# Patient Record
Sex: Male | Born: 1955
Health system: Southern US, Community
[De-identification: ages and names within clinical notes are randomized; demographics above are authoritative.]

---

## 2016-10-08 DIAGNOSIS — H409 Unspecified glaucoma: Secondary | ICD-10-CM | POA: Diagnosis not present

## 2017-06-11 DIAGNOSIS — Z0000 Encounter for general adult medical examination without abnormal findings: Secondary | ICD-10-CM | POA: Diagnosis not present

## 2017-06-11 DIAGNOSIS — Z125 Encounter for screening for malignant neoplasm of prostate: Secondary | ICD-10-CM | POA: Diagnosis not present

## 2017-06-11 DIAGNOSIS — Z1211 Encounter for screening for malignant neoplasm of colon: Secondary | ICD-10-CM | POA: Diagnosis not present

## 2017-06-11 DIAGNOSIS — Z13220 Encounter for screening for lipoid disorders: Secondary | ICD-10-CM | POA: Diagnosis not present

## 2017-06-11 DIAGNOSIS — Z23 Encounter for immunization: Secondary | ICD-10-CM | POA: Diagnosis not present

## 2017-06-11 DIAGNOSIS — H409 Unspecified glaucoma: Secondary | ICD-10-CM | POA: Diagnosis not present

## 2017-06-11 DIAGNOSIS — Z6830 Body mass index (BMI) 30.0-30.9, adult: Secondary | ICD-10-CM | POA: Diagnosis not present

## 2017-09-23 DIAGNOSIS — E782 Mixed hyperlipidemia: Secondary | ICD-10-CM | POA: Diagnosis not present

## 2018-03-14 DIAGNOSIS — H401131 Primary open-angle glaucoma, bilateral, mild stage: Secondary | ICD-10-CM | POA: Diagnosis not present

## 2018-06-16 DIAGNOSIS — E782 Mixed hyperlipidemia: Secondary | ICD-10-CM | POA: Diagnosis not present

## 2018-06-16 DIAGNOSIS — Z1211 Encounter for screening for malignant neoplasm of colon: Secondary | ICD-10-CM | POA: Diagnosis not present

## 2018-06-16 DIAGNOSIS — R351 Nocturia: Secondary | ICD-10-CM | POA: Diagnosis not present

## 2018-06-16 DIAGNOSIS — Z5181 Encounter for therapeutic drug level monitoring: Secondary | ICD-10-CM | POA: Diagnosis not present

## 2018-06-16 DIAGNOSIS — Z23 Encounter for immunization: Secondary | ICD-10-CM | POA: Diagnosis not present

## 2018-06-16 DIAGNOSIS — Z0000 Encounter for general adult medical examination without abnormal findings: Secondary | ICD-10-CM | POA: Diagnosis not present

## 2018-06-16 DIAGNOSIS — Z125 Encounter for screening for malignant neoplasm of prostate: Secondary | ICD-10-CM | POA: Diagnosis not present

## 2018-06-16 DIAGNOSIS — R739 Hyperglycemia, unspecified: Secondary | ICD-10-CM | POA: Diagnosis not present

## 2018-09-17 DIAGNOSIS — H401131 Primary open-angle glaucoma, bilateral, mild stage: Secondary | ICD-10-CM | POA: Diagnosis not present

## 2018-09-19 DIAGNOSIS — Z125 Encounter for screening for malignant neoplasm of prostate: Secondary | ICD-10-CM | POA: Diagnosis not present

## 2018-09-19 DIAGNOSIS — E782 Mixed hyperlipidemia: Secondary | ICD-10-CM | POA: Diagnosis not present

## 2018-10-29 DIAGNOSIS — H401131 Primary open-angle glaucoma, bilateral, mild stage: Secondary | ICD-10-CM | POA: Diagnosis not present

## 2019-10-02 ENCOUNTER — Ambulatory Visit
Admission: RE | Admit: 2019-10-02 | Discharge: 2019-10-02 | Disposition: A | Discharge status: Home / Self Care | Payer: BC Managed Care – PPO | Source: Ambulatory Visit | Attending: Family Medicine | Admitting: Family Medicine

## 2019-10-02 ENCOUNTER — Other Ambulatory Visit: Payer: Self-pay | Admitting: Family Medicine

## 2019-10-02 DIAGNOSIS — M160 Bilateral primary osteoarthritis of hip: Secondary | ICD-10-CM | POA: Diagnosis not present

## 2019-10-02 DIAGNOSIS — R52 Pain, unspecified: Secondary | ICD-10-CM

## 2019-10-02 DIAGNOSIS — M25552 Pain in left hip: Secondary | ICD-10-CM | POA: Diagnosis not present

## 2019-10-02 DIAGNOSIS — M25551 Pain in right hip: Secondary | ICD-10-CM | POA: Diagnosis not present

## 2019-11-10 DIAGNOSIS — M25552 Pain in left hip: Secondary | ICD-10-CM | POA: Diagnosis not present

## 2019-11-27 DIAGNOSIS — R739 Hyperglycemia, unspecified: Secondary | ICD-10-CM | POA: Diagnosis not present

## 2019-11-27 DIAGNOSIS — Z125 Encounter for screening for malignant neoplasm of prostate: Secondary | ICD-10-CM | POA: Diagnosis not present

## 2019-11-27 DIAGNOSIS — Z5181 Encounter for therapeutic drug level monitoring: Secondary | ICD-10-CM | POA: Diagnosis not present

## 2019-11-27 DIAGNOSIS — E782 Mixed hyperlipidemia: Secondary | ICD-10-CM | POA: Diagnosis not present

## 2019-11-27 DIAGNOSIS — Z0000 Encounter for general adult medical examination without abnormal findings: Secondary | ICD-10-CM | POA: Diagnosis not present

## 2019-11-30 DIAGNOSIS — Z1211 Encounter for screening for malignant neoplasm of colon: Secondary | ICD-10-CM | POA: Diagnosis not present

## 2019-12-16 DIAGNOSIS — L820 Inflamed seborrheic keratosis: Secondary | ICD-10-CM | POA: Diagnosis not present

## 2019-12-16 DIAGNOSIS — L814 Other melanin hyperpigmentation: Secondary | ICD-10-CM | POA: Diagnosis not present

## 2019-12-16 DIAGNOSIS — D225 Melanocytic nevi of trunk: Secondary | ICD-10-CM | POA: Diagnosis not present

## 2019-12-16 DIAGNOSIS — D1801 Hemangioma of skin and subcutaneous tissue: Secondary | ICD-10-CM | POA: Diagnosis not present

## 2019-12-16 DIAGNOSIS — L821 Other seborrheic keratosis: Secondary | ICD-10-CM | POA: Diagnosis not present

## 2020-01-11 DIAGNOSIS — H401131 Primary open-angle glaucoma, bilateral, mild stage: Secondary | ICD-10-CM | POA: Diagnosis not present

## 2020-02-01 ENCOUNTER — Other Ambulatory Visit: Payer: Self-pay

## 2020-02-01 ENCOUNTER — Emergency Department (HOSPITAL_COMMUNITY)
Admission: EM | Admit: 2020-02-01 | Discharge: 2020-02-02 | Disposition: A | Discharge status: Home / Self Care | Payer: BC Managed Care – PPO | Attending: Emergency Medicine | Admitting: Emergency Medicine

## 2020-02-01 DIAGNOSIS — N200 Calculus of kidney: Secondary | ICD-10-CM | POA: Diagnosis not present

## 2020-02-01 DIAGNOSIS — R1032 Left lower quadrant pain: Secondary | ICD-10-CM | POA: Insufficient documentation

## 2020-02-01 DIAGNOSIS — R112 Nausea with vomiting, unspecified: Secondary | ICD-10-CM | POA: Insufficient documentation

## 2020-02-01 DIAGNOSIS — R9431 Abnormal electrocardiogram [ECG] [EKG]: Secondary | ICD-10-CM | POA: Diagnosis not present

## 2020-02-01 DIAGNOSIS — R197 Diarrhea, unspecified: Secondary | ICD-10-CM | POA: Insufficient documentation

## 2020-02-01 DIAGNOSIS — R110 Nausea: Secondary | ICD-10-CM | POA: Diagnosis not present

## 2020-02-01 DIAGNOSIS — I10 Essential (primary) hypertension: Secondary | ICD-10-CM | POA: Diagnosis not present

## 2020-02-01 LAB — URINALYSIS, ROUTINE W REFLEX MICROSCOPIC
Bacteria, UA: NONE SEEN
Bilirubin Urine: NEGATIVE
Glucose, UA: NEGATIVE mg/dL
Ketones, ur: 20 mg/dL — AB
Leukocytes,Ua: NEGATIVE
Nitrite: NEGATIVE
Protein, ur: NEGATIVE mg/dL
Specific Gravity, Urine: 1.016 (ref 1.005–1.030)
pH: 6 (ref 5.0–8.0)

## 2020-02-01 LAB — COMPREHENSIVE METABOLIC PANEL
ALT: 23 U/L (ref 0–44)
AST: 18 U/L (ref 15–41)
Albumin: 4.6 g/dL (ref 3.5–5.0)
Alkaline Phosphatase: 68 U/L (ref 38–126)
Anion gap: 13 (ref 5–15)
BUN: 24 mg/dL — ABNORMAL HIGH (ref 8–23)
CO2: 23 mmol/L (ref 22–32)
Calcium: 9.2 mg/dL (ref 8.9–10.3)
Chloride: 105 mmol/L (ref 98–111)
Creatinine, Ser: 1.74 mg/dL — ABNORMAL HIGH (ref 0.61–1.24)
GFR calc Af Amer: 47 mL/min — ABNORMAL LOW (ref >60)
GFR calc non Af Amer: 41 mL/min — ABNORMAL LOW (ref >60)
Glucose, Bld: 139 mg/dL — ABNORMAL HIGH (ref 70–99)
Potassium: 4.3 mmol/L (ref 3.5–5.1)
Sodium: 141 mmol/L (ref 135–145)
Total Bilirubin: 0.8 mg/dL (ref 0.3–1.2)
Total Protein: 7.1 g/dL (ref 6.5–8.1)

## 2020-02-01 LAB — CBC
HCT: 46.5 % (ref 39.0–52.0)
Hemoglobin: 16 g/dL (ref 13.0–17.0)
MCH: 32 pg (ref 26.0–34.0)
MCHC: 34.4 g/dL (ref 30.0–36.0)
MCV: 93 fL (ref 80.0–100.0)
Platelets: 192 10*3/uL (ref 150–400)
RBC: 5 MIL/uL (ref 4.22–5.81)
RDW: 13.1 % (ref 11.5–15.5)
WBC: 12.3 10*3/uL — ABNORMAL HIGH (ref 4.0–10.5)
nRBC: 0 % (ref 0.0–0.2)

## 2020-02-01 LAB — LIPASE, BLOOD: Lipase: 25 U/L (ref 11–51)

## 2020-02-01 MED ORDER — SODIUM CHLORIDE 0.9 % IV SOLN
1000.0000 mL | INTRAVENOUS | Status: DC
  Administered 2020-02-01: 1000 mL via INTRAVENOUS

## 2020-02-01 MED ORDER — HYDROMORPHONE HCL 1 MG/ML IJ SOLN
1.0000 mg | Freq: Once | INTRAMUSCULAR | Status: AC
  Administered 2020-02-01: 1 mg via INTRAVENOUS
  Filled 2020-02-01: qty 1

## 2020-02-01 MED ORDER — SODIUM CHLORIDE 0.9 % IV BOLUS (SEPSIS)
1000.0000 mL | Freq: Once | INTRAVENOUS | Status: AC
  Administered 2020-02-01: 1000 mL via INTRAVENOUS

## 2020-02-01 MED ORDER — SODIUM CHLORIDE 0.9% FLUSH
3.0000 mL | Freq: Once | INTRAVENOUS | Status: AC
  Administered 2020-02-01: 3 mL via INTRAVENOUS

## 2020-02-01 MED ORDER — ONDANSETRON HCL 4 MG/2ML IJ SOLN
4.0000 mg | Freq: Once | INTRAMUSCULAR | Status: AC | PRN
  Administered 2020-02-01: 4 mg via INTRAVENOUS
  Filled 2020-02-01: qty 2

## 2020-02-01 NOTE — ED Notes (Addendum)
Patient had an episode of incontinence, this nurse and EMT Ashley bladder scanned patient x3 post void each time 29ml. Patient unable to provide urine sample at this time.

## 2020-02-01 NOTE — ED Provider Notes (Signed)
[facility] DEPT Provider Note   CSN: [id number] Arrival date & time: 02/01/20  2125     History abd pain  Joshua Copeland is a 64 y.o. male.  HPI   Patient presents emergency room for evaluation of abdominal pain.  Patient states he started having some episodes of nausea vomiting and diarrhea for the past day.  Patient started developing abdominal pain in the left lower quadrant.  The pain has been increasing in severity.  It is very severe right now.  Denies any blood in his stools.  He denies any fevers or chills.  No dysuria.  Patient denies any prior history abdominal pain problems.  He denies any prior surgeries.  History reviewed. No pertinent past medical history.  There are no problems to display for this patient.   History reviewed. No pertinent surgical history.     History reviewed. No pertinent family history.  Social History   Tobacco Use  . Smoking status: Not on file  Substance Use Topics  . Alcohol use: Not on file  . Drug use: Not on file    Home Medications Prior to Admission medications   Medication Sig Start Date End Date Taking? Authorizing Provider  latanoprost (XALATAN) 0.005 % ophthalmic solution Place 1 drop into both eyes daily.  01/11/20  Yes [provider]  meloxicam (MOBIC) 15 MG tablet Take 15 mg by mouth daily. 01/24/20  Yes [provider]  pravastatin (PRAVACHOL) 20 MG tablet Take 20 mg by mouth daily. 12/21/19  Yes [provider]  timolol (TIMOPTIC) 0.25 % ophthalmic solution Place 1 drop into both eyes daily. 01/12/20  Yes [provider]    Allergies    Patient has no known allergies.  Review of Systems   Review of Systems  All other systems reviewed and are negative.   Physical Exam Updated Vital Signs BP (!) 150/89   Pulse 85   Temp 98 F (36.7 C)   Resp 15   SpO2 94%   Physical Exam Vitals and nursing note reviewed.  Constitutional:      General: He is  not in acute distress.    Appearance: He is well-developed.  HENT:     Head: Normocephalic and atraumatic.     Right Ear: External ear normal.     Left Ear: External ear normal.  Eyes:     General: No scleral icterus.       Right eye: No discharge.        Left eye: No discharge.     Conjunctiva/sclera: Conjunctivae normal.  Neck:     Trachea: No tracheal deviation.  Cardiovascular:     Rate and Rhythm: Normal rate and regular rhythm.  Pulmonary:     Effort: Pulmonary effort is normal. No respiratory distress.     Breath sounds: Normal breath sounds. No stridor. No wheezing or rales.  Abdominal:     General: Bowel sounds are normal. There is no distension.     Palpations: Abdomen is soft.     Tenderness: There is abdominal tenderness. There is guarding. There is no rebound.     Hernia: No hernia is present.     Comments: Tenderness palpation left lower quadrant  Musculoskeletal:        General: No tenderness.     Cervical back: Neck supple.  Skin:    General: Skin is warm and dry.     Findings: No rash.  Neurological:     Mental Status: He is  alert.     Cranial Nerves: No cranial nerve deficit (no facial droop, extraocular movements intact, no slurred speech).     Sensory: No sensory deficit.     Motor: No abnormal muscle tone or seizure activity.     Coordination: Coordination normal.     ED Results / Procedures / Treatments   Labs (all labs ordered are listed, but only abnormal results are displayed) Labs Reviewed  URINALYSIS, ROUTINE W REFLEX MICROSCOPIC - Abnormal; Notable for the following components:      Result Value   Hgb urine dipstick SMALL (*)    Ketones, ur 20 (*)    All other components within normal limits  COMPREHENSIVE METABOLIC PANEL - Abnormal; Notable for the following components:   Glucose, Bld 139 (*)    BUN 24 (*)    Creatinine, Ser 1.74 (*)    GFR calc non Af Amer 41 (*)    GFR calc Af Amer 47 (*)    All other components within normal limits   CBC - Abnormal; Notable for the following components:   WBC 12.3 (*)    All other components within normal limits  LIPASE, BLOOD  MAGNESIUM    EKG EKG Interpretation  Date/Time:  Monday Feb 01 2020 23:12:22 EDT Ventricular Rate:  59 PR Interval:    QRS Duration: 103 QT Interval:  464 QTC Calculation: 508 R Axis:   16 Text Interpretation: Sinus arrhythmia Prolonged QT interval No previous tracing Confirmed by Dorie Rank [id number]) on 02/01/2020 11:38:40 PM   Radiology No results found.  Procedures Procedures (including critical care time)  Medications Ordered in ED Medications  sodium chloride 0.9 % bolus 1,000 mL (0 mLs Intravenous Stopped 02/02/20 0020)    Followed by  0.9 %  sodium chloride infusion (1,000 mLs Intravenous New Bag/Given 02/01/20 2350)  sodium chloride flush (NS) 0.9 % injection 3 mL (3 mLs Intravenous Given 02/01/20 2315)  ondansetron (ZOFRAN) injection 4 mg (4 mg Intravenous Given 02/01/20 2311)  HYDROmorphone (DILAUDID) injection 1 mg (1 mg Intravenous Given 02/01/20 2349)    ED Course  I have reviewed the triage vital signs and the nursing notes.  Pertinent labs & imaging results that were available during my care of the patient were reviewed by me and considered in my medical decision making (see chart for details).  Clinical Course as of Feb 01 113  Mon Feb 01, 2020  2355 Patient noted to have sinus arrhythmia on the monitor.  I will add on magnesium level.   [JK]  Tue Feb 02, 2020  0113 CBC with elevated white blood cell count.  Metabolic panel notable for elevated BUN and creatinine.  Urinalysis does show hematuria   [JK]  0113 CT abdomen pelvis ordered for further evaluation.   [JK]  0113 Pain has improved after IV Dilaudid.  Vitals are stable   [JK]    Clinical Course User Index [JK] Dorie Rank, MD   MDM Rules/Calculators/A&P                     MDM Number of Diagnoses or Management Options Diagnosis management comments: DDX  Pancreatitis, bowel obstruction, diverticulitis, perf viscous, ureteral stone    Amount and/or Complexity of Data Reviewed Clinical lab tests: ordered and reviewed Tests in the radiology section of CPT: ordered and reviewed Discussion of test results with the performing providers: yes Obtain history from someone other than the patient: yes Discuss the patient with other providers: yes Independent visualization of  images, tracings, or specimens: yes  Risk of Complications, Morbidity, and/or Mortality Presenting problems: high Diagnostic procedures: moderate Management options: high   Care turned over to Dr Dina Rich pending CT Final Clinical Impression(s) / ED Diagnoses pending   Dorie Rank, MD 02/02/20 0114

## 2020-02-01 NOTE — ED Notes (Signed)
Lab contacted to add on magnesium.

## 2020-02-01 NOTE — ED Notes (Signed)
Attempted x 3 to obtain blood pressure. Delay due to patient moving.

## 2020-02-01 NOTE — ED Triage Notes (Signed)
PER EMS: Pt is coming from home with c/o diarrhea. Patient states he has had N/V/D for the past day. LLQ abdominal pain that started a few hours ago. Denies any blood or tarry stools. A&Ox4 and ambulatory.  EMS MEDS:  4mg  zofran   EMS VITALS: BP 190/110 HR 70 SPO2 100% CBG 113  20G LHand

## 2020-02-01 NOTE — ED Notes (Signed)
Patient and family continue to ask about when the provider will arrive. This nurse again apologized about the delay and stated that the provider will be in as soon as they can.

## 2020-02-01 NOTE — ED Notes (Signed)
Patients family came out to the nurses station x2 stating her husband needs help asap. This nurse has spoken to the patient and family assuring them we will have a provider see them as soon as possible.   Patient now stating he feels the urge to pee.

## 2020-02-01 NOTE — ED Notes (Signed)
Patient had one episode of urinary incontinence and was able to ambulate to the restroom afterwards without assistance.

## 2020-02-02 ENCOUNTER — Emergency Department (HOSPITAL_COMMUNITY): Payer: BC Managed Care – PPO

## 2020-02-02 ENCOUNTER — Encounter (HOSPITAL_COMMUNITY): Payer: Self-pay | Admitting: Emergency Medicine

## 2020-02-02 DIAGNOSIS — R109 Unspecified abdominal pain: Secondary | ICD-10-CM | POA: Diagnosis not present

## 2020-02-02 LAB — MAGNESIUM: Magnesium: 2.1 mg/dL (ref 1.7–2.4)

## 2020-02-02 MED ORDER — ONDANSETRON HCL 4 MG PO TABS
4.0000 mg | ORAL_TABLET | Freq: Four times a day (QID) | ORAL | 0 refills | Status: DC
Start: 2020-02-02 — End: 2021-10-31

## 2020-02-02 MED ORDER — ACETAMINOPHEN 500 MG PO TABS
500.0000 mg | ORAL_TABLET | Freq: Four times a day (QID) | ORAL | 0 refills | Status: DC | PRN
Start: 2020-02-02 — End: 2021-10-31

## 2020-02-02 NOTE — Discharge Instructions (Addendum)
You were seen today and found to have a recently passed kidney stone.  Your pain should incrementally improve.  Take Tylenol for any ongoing discomfort.  You also be given Zofran for nausea.  Follow-up with urology as needed.

## 2020-02-02 NOTE — ED Provider Notes (Signed)
Patient signed out pending CT scan.  CT scan independently reviewed by myself.  He has a 2 mm stone in his bladder likely recently passed.  On repeat evaluation, patient states he feels much better.  He is able to tolerate fluids.  Suspect he will continue to improve.  Did discuss with him that he may have some residual spasm.  Not a candidate for anti-inflammatory medications.  Given that he is comfortable, do not feel he needs narcotic meds at this time.  Will discharge with Tylenol and Zofran.  Physical Exam  BP (!) 158/94   Pulse 81   Temp 98 F (36.7 C)   Resp 13   SpO2 95%   Physical Exam  ED Course/Procedures   Clinical Course as of Feb 02 156  Mon Feb 01, 2020  2355 Patient noted to have sinus arrhythmia on the monitor.  I will add on magnesium level.   [JK]  Tue Feb 02, 2020  0113 CBC with elevated white blood cell count.  Metabolic panel notable for elevated BUN and creatinine.  Urinalysis does show hematuria   [JK]  0113 CT abdomen pelvis ordered for further evaluation.   [JK]  0113 Pain has improved after IV Dilaudid.  Vitals are stable   [JK]    Clinical Course User Index [JK] Dorie Rank, MD    Procedures  MDM   Problem List Items Addressed This Visit    None    Visit Diagnoses    Kidney stone    -  Primary   Relevant Medications   HYDROmorphone (DILAUDID) injection 1 mg (Completed)          Malkie Wille, Barbette Hair, MD 02/02/20 0200

## 2020-02-03 DIAGNOSIS — H401131 Primary open-angle glaucoma, bilateral, mild stage: Secondary | ICD-10-CM | POA: Diagnosis not present

## 2020-05-04 DIAGNOSIS — H401131 Primary open-angle glaucoma, bilateral, mild stage: Secondary | ICD-10-CM | POA: Diagnosis not present

## 2020-06-07 DIAGNOSIS — M5430 Sciatica, unspecified side: Secondary | ICD-10-CM | POA: Diagnosis not present

## 2020-08-03 DIAGNOSIS — H401131 Primary open-angle glaucoma, bilateral, mild stage: Secondary | ICD-10-CM | POA: Diagnosis not present

## 2020-08-15 DIAGNOSIS — M5416 Radiculopathy, lumbar region: Secondary | ICD-10-CM | POA: Diagnosis not present

## 2020-12-07 DIAGNOSIS — H401131 Primary open-angle glaucoma, bilateral, mild stage: Secondary | ICD-10-CM | POA: Diagnosis not present

## 2020-12-08 DIAGNOSIS — R739 Hyperglycemia, unspecified: Secondary | ICD-10-CM | POA: Diagnosis not present

## 2020-12-08 DIAGNOSIS — H409 Unspecified glaucoma: Secondary | ICD-10-CM | POA: Diagnosis not present

## 2020-12-08 DIAGNOSIS — Z0000 Encounter for general adult medical examination without abnormal findings: Secondary | ICD-10-CM | POA: Diagnosis not present

## 2020-12-08 DIAGNOSIS — Z1211 Encounter for screening for malignant neoplasm of colon: Secondary | ICD-10-CM | POA: Diagnosis not present

## 2020-12-08 DIAGNOSIS — Z5181 Encounter for therapeutic drug level monitoring: Secondary | ICD-10-CM | POA: Diagnosis not present

## 2020-12-08 DIAGNOSIS — Z125 Encounter for screening for malignant neoplasm of prostate: Secondary | ICD-10-CM | POA: Diagnosis not present

## 2020-12-08 DIAGNOSIS — E782 Mixed hyperlipidemia: Secondary | ICD-10-CM | POA: Diagnosis not present

## 2020-12-08 DIAGNOSIS — R030 Elevated blood-pressure reading, without diagnosis of hypertension: Secondary | ICD-10-CM | POA: Diagnosis not present

## 2020-12-12 DIAGNOSIS — Z1211 Encounter for screening for malignant neoplasm of colon: Secondary | ICD-10-CM | POA: Diagnosis not present

## 2020-12-13 DIAGNOSIS — L239 Allergic contact dermatitis, unspecified cause: Secondary | ICD-10-CM | POA: Diagnosis not present

## 2020-12-31 IMAGING — CT CT ABD-PELV W/O CM
2 of 4 series · 16 of 46 positions shown, 18 images · non-contrast
Comparison: None.

CLINICAL DATA: Left flank pain for several hours with mildly
elevated white blood cell count

EXAM:
CT ABDOMEN AND PELVIS WITHOUT CONTRAST
TECHNIQUE: Multidetector CT imaging of the abdomen and pelvis was performed
following the standard protocol without IV contrast.

[Series 2: axial st · axial · 0.78mm/px · z∈[+1156,+1606]mm · 13 of 102 slices shown, 15 images]
[im 6/102  soft-tissue]
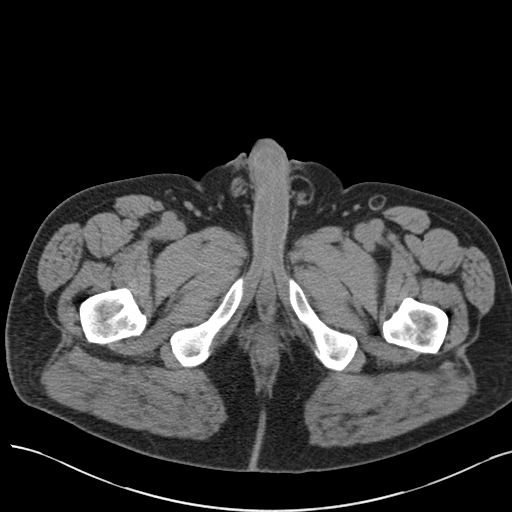
[im 6/102  bone]
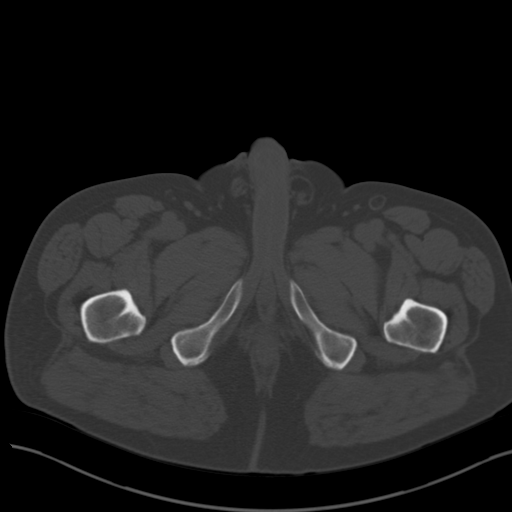
[im 12/102  soft-tissue]
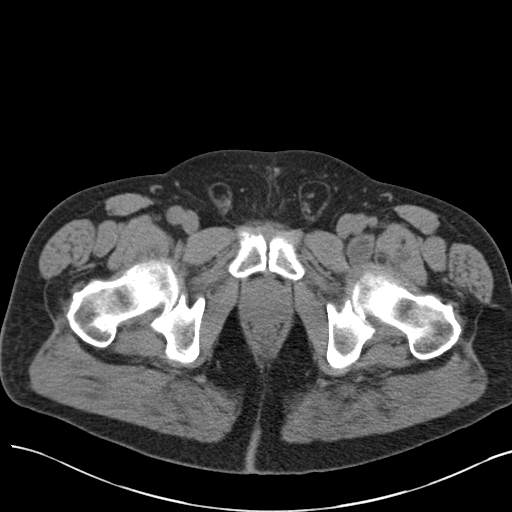
[im 24/102  soft-tissue]
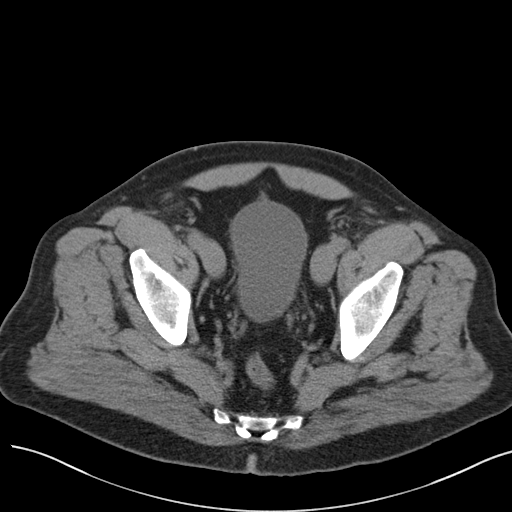
[im 30/102  soft-tissue]
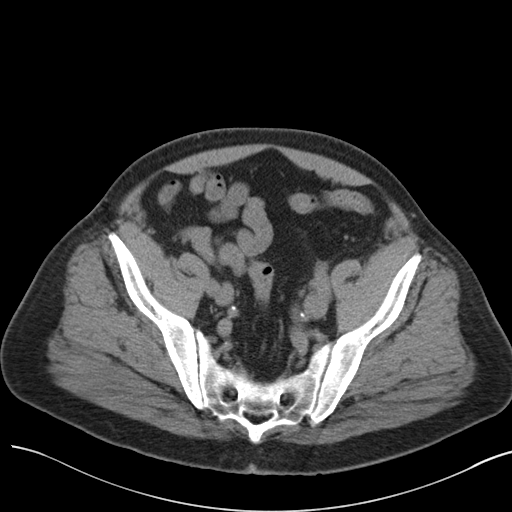
[im 36/102  soft-tissue]
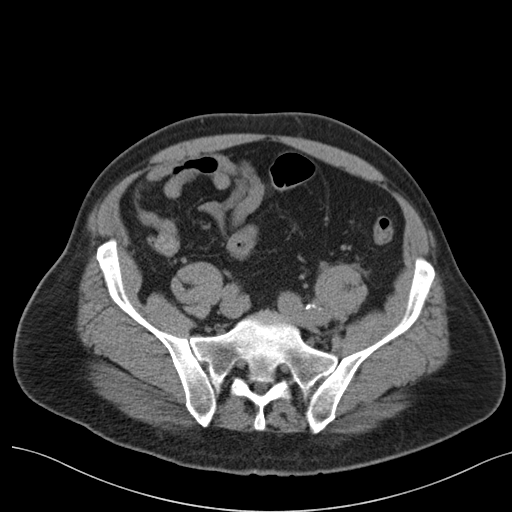
[im 42/102  soft-tissue]
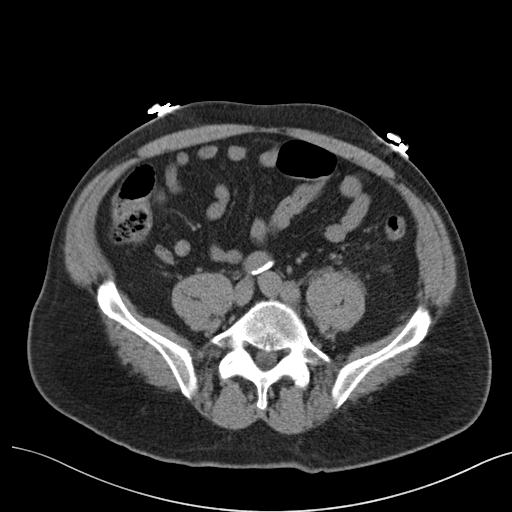
[im 54/102  soft-tissue]
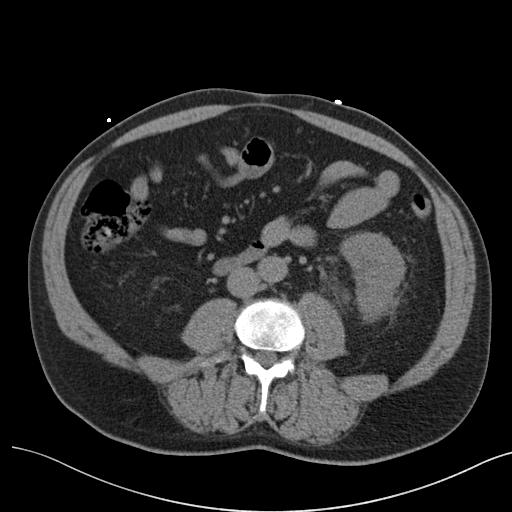
[im 60/102  soft-tissue]
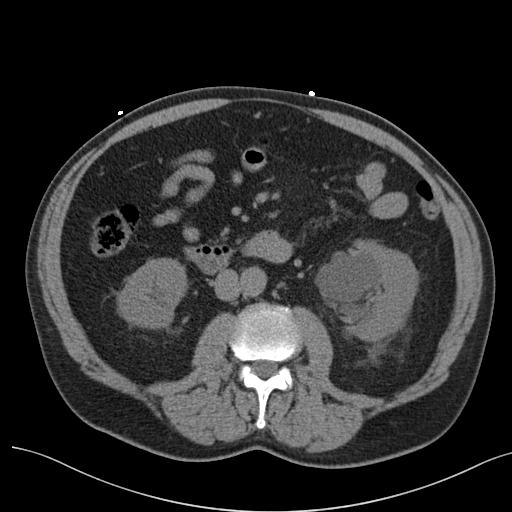
[im 66/102  soft-tissue]
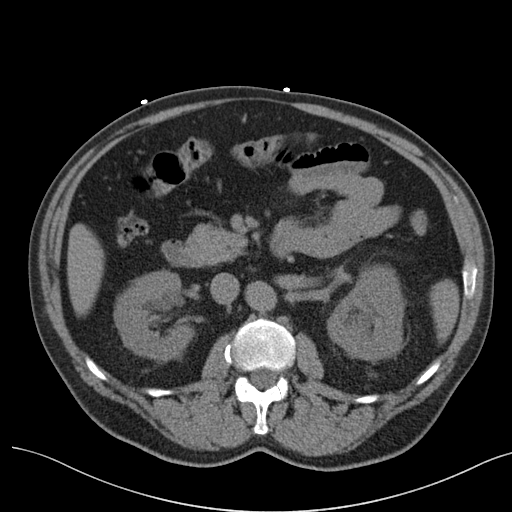
[im 66/102  bone]
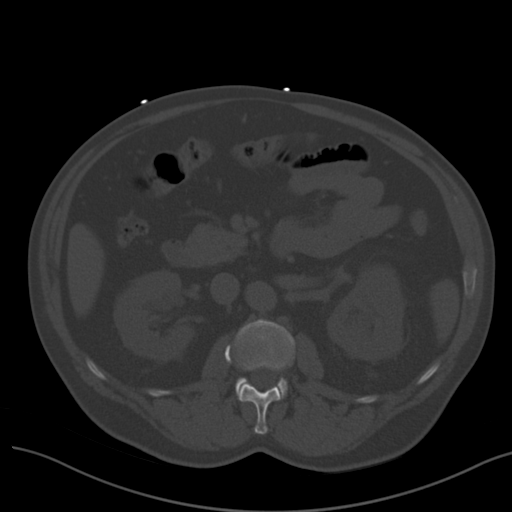
[im 72/102  soft-tissue]
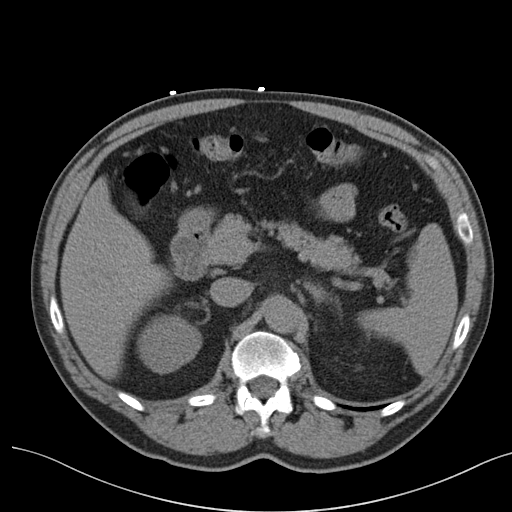
[im 78/102  soft-tissue]
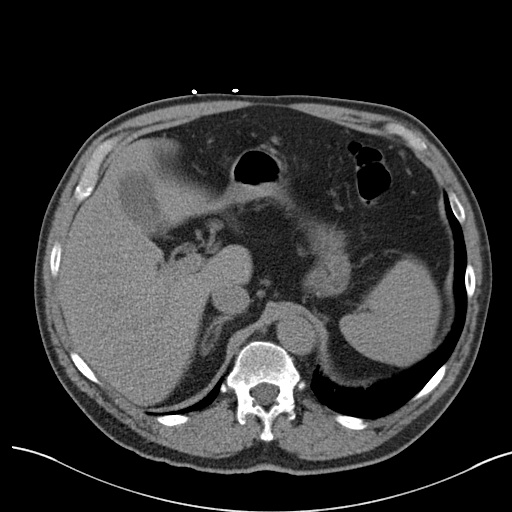
[im 90/102  soft-tissue]
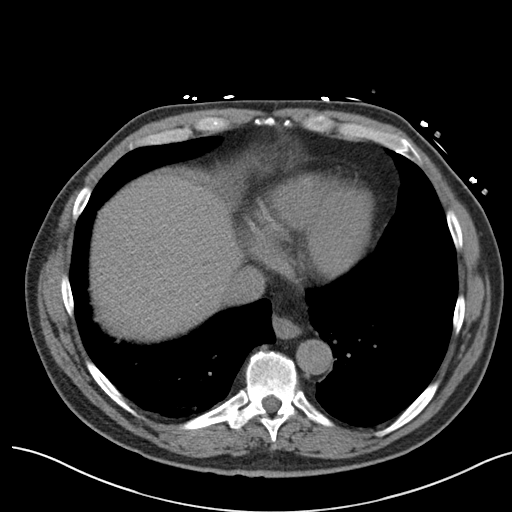
[im 96/102  soft-tissue]
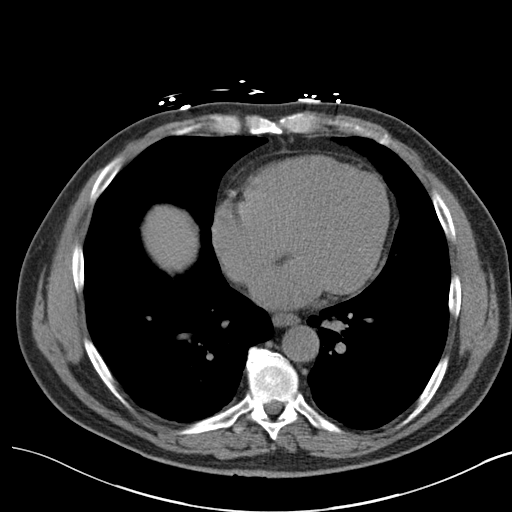

[Series 4: coronal st · coronal · 0.74mm/px · 3 of 148 slices shown]
[im 50/148  soft-tissue]
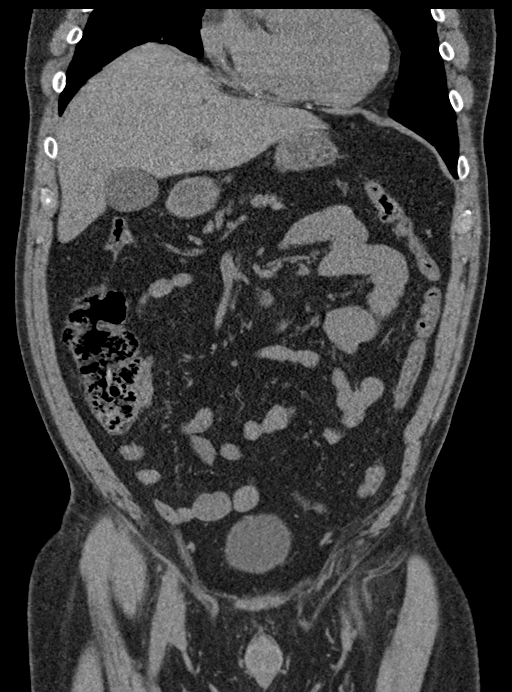
[im 66/148  soft-tissue]
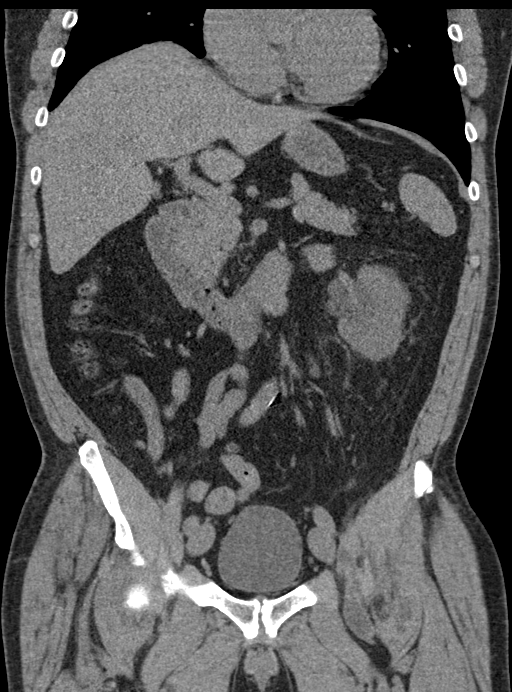
[im 82/148  soft-tissue]
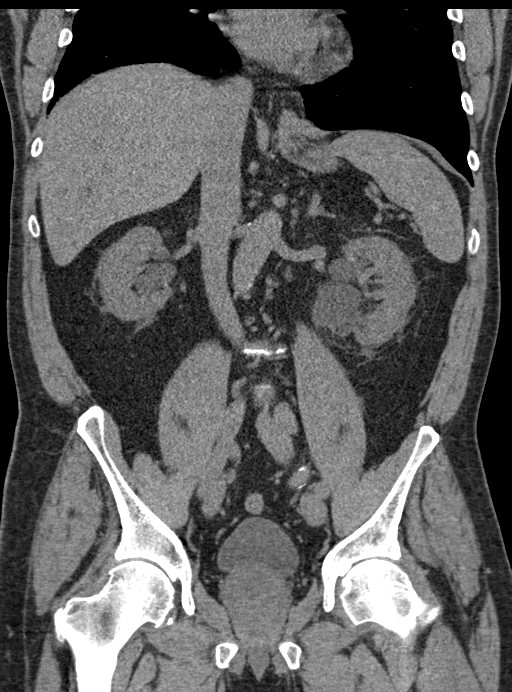

[16 of 46 positions shown; findings below may reference images not displayed]

FINDINGS: Lower chest: Mild dependent atelectatic changes are noted.

Hepatobiliary: No focal liver abnormality is seen. No gallstones,
gallbladder wall thickening, or biliary dilatation.

Pancreas: Unremarkable. No pancreatic ductal dilatation or
surrounding inflammatory changes.

Spleen: Normal in size without focal abnormality.

Adrenals/Urinary Tract: Adrenal glands are within normal limits.
Right kidney demonstrates peripelvic cysts. No renal calculi or
obstructive changes are noted. The right ureter is unremarkable. The
left kidney demonstrates increased perinephric stranding as well as
some parapelvic cysts and mild prominence of the collecting system
and ureter. No ureteral stone is seen although a tiny 2 mm
calcification is noted within the dependent portion of the urinary
bladder likely related to a recently passed stone.

Stomach/Bowel: Mild diverticular change of the colon is noted. No
diverticulitis is seen. The appendix is within normal limits. Small
bowel and stomach are unremarkable aside from a small sliding-type
hiatal hernia.

Vascular/Lymphatic: Aortic atherosclerosis. No enlarged abdominal or
pelvic lymph nodes.

Reproductive: Prostate is unremarkable.

Other: No abdominal wall hernia or abnormality. No abdominopelvic
ascites.

Musculoskeletal: No acute or significant osseous findings.
IMPRESSION: 2 mm stone within the urinary bladder in its dependent portion
consistent with a recently passed stone. There are changes on the
left consistent with a recently passed stone with perinephric
stranding and mild fullness of the collecting system and ureter.

Bilateral parapelvic cysts.

Diverticulosis without diverticulitis.

## 2021-01-10 DIAGNOSIS — Z20822 Contact with and (suspected) exposure to covid-19: Secondary | ICD-10-CM | POA: Diagnosis not present

## 2021-01-30 DIAGNOSIS — R030 Elevated blood-pressure reading, without diagnosis of hypertension: Secondary | ICD-10-CM | POA: Diagnosis not present

## 2021-01-30 DIAGNOSIS — H409 Unspecified glaucoma: Secondary | ICD-10-CM | POA: Diagnosis not present

## 2021-01-30 DIAGNOSIS — Z6830 Body mass index (BMI) 30.0-30.9, adult: Secondary | ICD-10-CM | POA: Diagnosis not present

## 2021-01-30 DIAGNOSIS — Z809 Family history of malignant neoplasm, unspecified: Secondary | ICD-10-CM | POA: Diagnosis not present

## 2021-01-30 DIAGNOSIS — E785 Hyperlipidemia, unspecified: Secondary | ICD-10-CM | POA: Diagnosis not present

## 2021-01-30 DIAGNOSIS — Z8249 Family history of ischemic heart disease and other diseases of the circulatory system: Secondary | ICD-10-CM | POA: Diagnosis not present

## 2021-01-30 DIAGNOSIS — E669 Obesity, unspecified: Secondary | ICD-10-CM | POA: Diagnosis not present

## 2021-04-14 DIAGNOSIS — H401131 Primary open-angle glaucoma, bilateral, mild stage: Secondary | ICD-10-CM | POA: Diagnosis not present

## 2021-05-12 DIAGNOSIS — H401131 Primary open-angle glaucoma, bilateral, mild stage: Secondary | ICD-10-CM | POA: Diagnosis not present

## 2021-08-15 DIAGNOSIS — M25569 Pain in unspecified knee: Secondary | ICD-10-CM | POA: Diagnosis not present

## 2021-08-15 DIAGNOSIS — M25559 Pain in unspecified hip: Secondary | ICD-10-CM | POA: Diagnosis not present

## 2021-08-21 DIAGNOSIS — H109 Unspecified conjunctivitis: Secondary | ICD-10-CM | POA: Diagnosis not present

## 2021-08-21 DIAGNOSIS — H9209 Otalgia, unspecified ear: Secondary | ICD-10-CM | POA: Diagnosis not present

## 2021-08-21 DIAGNOSIS — J029 Acute pharyngitis, unspecified: Secondary | ICD-10-CM | POA: Diagnosis not present

## 2021-08-21 DIAGNOSIS — R0981 Nasal congestion: Secondary | ICD-10-CM | POA: Diagnosis not present

## 2021-09-11 DIAGNOSIS — L989 Disorder of the skin and subcutaneous tissue, unspecified: Secondary | ICD-10-CM | POA: Diagnosis not present

## 2021-09-11 DIAGNOSIS — K219 Gastro-esophageal reflux disease without esophagitis: Secondary | ICD-10-CM | POA: Diagnosis not present

## 2021-09-11 DIAGNOSIS — I10 Essential (primary) hypertension: Secondary | ICD-10-CM | POA: Diagnosis not present

## 2021-09-11 DIAGNOSIS — M25569 Pain in unspecified knee: Secondary | ICD-10-CM | POA: Diagnosis not present

## 2021-09-11 DIAGNOSIS — R059 Cough, unspecified: Secondary | ICD-10-CM | POA: Diagnosis not present

## 2021-09-11 DIAGNOSIS — D72820 Lymphocytosis (symptomatic): Secondary | ICD-10-CM | POA: Diagnosis not present

## 2021-10-03 DIAGNOSIS — D72820 Lymphocytosis (symptomatic): Secondary | ICD-10-CM | POA: Diagnosis not present

## 2021-10-03 DIAGNOSIS — M48062 Spinal stenosis, lumbar region with neurogenic claudication: Secondary | ICD-10-CM | POA: Diagnosis not present

## 2021-10-03 DIAGNOSIS — E782 Mixed hyperlipidemia: Secondary | ICD-10-CM | POA: Diagnosis not present

## 2021-10-03 DIAGNOSIS — Z79899 Other long term (current) drug therapy: Secondary | ICD-10-CM | POA: Diagnosis not present

## 2021-10-10 ENCOUNTER — Other Ambulatory Visit: Payer: Self-pay | Admitting: *Deleted

## 2021-10-10 ENCOUNTER — Telehealth: Payer: Self-pay | Admitting: Oncology

## 2021-10-10 DIAGNOSIS — D72829 Elevated white blood cell count, unspecified: Secondary | ICD-10-CM

## 2021-10-10 NOTE — Telephone Encounter (Signed)
Attempted to contact patient to schedule initial visit from referral. No answer but voicemail was left for patient to call back.

## 2021-10-10 NOTE — Progress Notes (Signed)
New patient appt for 2/28 and labs two days before. Lab orders entered

## 2021-10-11 ENCOUNTER — Telehealth: Payer: Self-pay | Admitting: Oncology

## 2021-10-11 NOTE — Telephone Encounter (Signed)
Attempted to contact patient to schedule initial visit from referral. No answer so voicemail was left for patient to call back. ?

## 2021-10-17 DIAGNOSIS — M1611 Unilateral primary osteoarthritis, right hip: Secondary | ICD-10-CM | POA: Diagnosis not present

## 2021-10-17 DIAGNOSIS — M5451 Vertebrogenic low back pain: Secondary | ICD-10-CM | POA: Diagnosis not present

## 2021-10-20 DIAGNOSIS — M1712 Unilateral primary osteoarthritis, left knee: Secondary | ICD-10-CM | POA: Diagnosis not present

## 2021-10-20 DIAGNOSIS — M25551 Pain in right hip: Secondary | ICD-10-CM | POA: Diagnosis not present

## 2021-10-20 DIAGNOSIS — M170 Bilateral primary osteoarthritis of knee: Secondary | ICD-10-CM | POA: Diagnosis not present

## 2021-10-26 DIAGNOSIS — D1801 Hemangioma of skin and subcutaneous tissue: Secondary | ICD-10-CM | POA: Diagnosis not present

## 2021-10-26 DIAGNOSIS — L814 Other melanin hyperpigmentation: Secondary | ICD-10-CM | POA: Diagnosis not present

## 2021-10-26 DIAGNOSIS — L568 Other specified acute skin changes due to ultraviolet radiation: Secondary | ICD-10-CM | POA: Diagnosis not present

## 2021-10-26 DIAGNOSIS — L820 Inflamed seborrheic keratosis: Secondary | ICD-10-CM | POA: Diagnosis not present

## 2021-10-26 DIAGNOSIS — D2371 Other benign neoplasm of skin of right lower limb, including hip: Secondary | ICD-10-CM | POA: Diagnosis not present

## 2021-10-26 DIAGNOSIS — Z789 Other specified health status: Secondary | ICD-10-CM | POA: Diagnosis not present

## 2021-10-26 DIAGNOSIS — L538 Other specified erythematous conditions: Secondary | ICD-10-CM | POA: Diagnosis not present

## 2021-10-26 DIAGNOSIS — L821 Other seborrheic keratosis: Secondary | ICD-10-CM | POA: Diagnosis not present

## 2021-10-27 ENCOUNTER — Other Ambulatory Visit: Payer: Self-pay

## 2021-10-27 ENCOUNTER — Inpatient Hospital Stay: Payer: Medicare HMO | Attending: Oncology

## 2021-10-27 DIAGNOSIS — D72820 Lymphocytosis (symptomatic): Secondary | ICD-10-CM | POA: Insufficient documentation

## 2021-10-27 DIAGNOSIS — D72829 Elevated white blood cell count, unspecified: Secondary | ICD-10-CM

## 2021-10-27 LAB — CBC WITH DIFFERENTIAL (CANCER CENTER ONLY)
Abs Immature Granulocytes: 0.04 10*3/uL (ref 0.00–0.07)
Basophils Absolute: 0 10*3/uL (ref 0.0–0.1)
Basophils Relative: 0 %
Eosinophils Absolute: 0.2 10*3/uL (ref 0.0–0.5)
Eosinophils Relative: 2 %
HCT: 49.5 % (ref 39.0–52.0)
Hemoglobin: 16 g/dL (ref 13.0–17.0)
Immature Granulocytes: 0 %
Lymphocytes Relative: 56 %
Lymphs Abs: 7.3 10*3/uL — ABNORMAL HIGH (ref 0.7–4.0)
MCH: 30.3 pg (ref 26.0–34.0)
MCHC: 32.3 g/dL (ref 30.0–36.0)
MCV: 93.8 fL (ref 80.0–100.0)
Monocytes Absolute: 0.6 10*3/uL (ref 0.1–1.0)
Monocytes Relative: 5 %
Neutro Abs: 4.8 10*3/uL (ref 1.7–7.7)
Neutrophils Relative %: 37 %
Platelet Count: 195 10*3/uL (ref 150–400)
RBC: 5.28 MIL/uL (ref 4.22–5.81)
RDW: 13.9 % (ref 11.5–15.5)
WBC Count: 13 10*3/uL — ABNORMAL HIGH (ref 4.0–10.5)
nRBC: 0 % (ref 0.0–0.2)

## 2021-10-27 LAB — SAVE SMEAR(SSMR), FOR PROVIDER SLIDE REVIEW

## 2021-10-30 DIAGNOSIS — H6983 Other specified disorders of Eustachian tube, bilateral: Secondary | ICD-10-CM | POA: Diagnosis not present

## 2021-10-31 ENCOUNTER — Inpatient Hospital Stay: Payer: Medicare HMO | Admitting: Oncology

## 2021-10-31 ENCOUNTER — Other Ambulatory Visit: Payer: Self-pay

## 2021-10-31 VITALS — BP 152/93 | HR 73 | Temp 97.8°F | Resp 18 | Ht 71.0 in | Wt 216.4 lb

## 2021-10-31 DIAGNOSIS — D72820 Lymphocytosis (symptomatic): Secondary | ICD-10-CM | POA: Diagnosis not present

## 2021-10-31 DIAGNOSIS — I10 Essential (primary) hypertension: Secondary | ICD-10-CM

## 2021-10-31 DIAGNOSIS — M1712 Unilateral primary osteoarthritis, left knee: Secondary | ICD-10-CM

## 2021-10-31 DIAGNOSIS — E785 Hyperlipidemia, unspecified: Secondary | ICD-10-CM | POA: Diagnosis not present

## 2021-10-31 DIAGNOSIS — D72829 Elevated white blood cell count, unspecified: Secondary | ICD-10-CM

## 2021-10-31 DIAGNOSIS — K219 Gastro-esophageal reflux disease without esophagitis: Secondary | ICD-10-CM

## 2021-10-31 DIAGNOSIS — Z87442 Personal history of urinary calculi: Secondary | ICD-10-CM | POA: Diagnosis not present

## 2021-10-31 DIAGNOSIS — H409 Unspecified glaucoma: Secondary | ICD-10-CM | POA: Diagnosis not present

## 2021-10-31 LAB — LACTATE DEHYDROGENASE: LDH: 120 U/L (ref 98–192)

## 2021-10-31 NOTE — Progress Notes (Signed)
Joshua Copeland   Requesting MD: Glenis Smoker, [address],  [place] [address]   Joshua Copeland 66 y.o.  1955/12/23    Reason for Copeland: Leukocytosis   HPI: Joshua Copeland was seen for a routine appointment at [facility] on 10/04/2021.  A CBC found the hemoglobin at 14.9, hematocrit 45%, MCV 90.6, platelets 212,000, WBC 11.3, ANC 3.6, and absolute lymphocyte count 6.9.  A CBC on 02/01/2020 found the WBC at 12.3 with a hemoglobin of 16, and platelets 192,000.  Joshua Copeland is not aware of a previous history of leukocytosis.  Joshua Copeland feels well.  Past medical history: Hypertension Gastroesophageal reflux Hyperlipidemia Arthritis of the left knee and right hip-followed by orthopedics Kidney stone Glaucoma  Past surgical history: Left leg fracture surgery as a child Multiple knee arthroscopy procedures  Medications: Reviewed  Allergies: No Known Allergies  Family history: Father died of "liver cancer "at age 58  Social History:   Joshua Copeland lives with his wife in [place].  Joshua Copeland is retired.  Joshua Copeland has lived in [place] since 2017 after living in [place].  Joshua Copeland does not use cigarettes.  Joshua Copeland has 2 to 3 glasses of wine each night with dinner.  No transfusion history.  No risk factor for HIV or hepatitis.  ROS:   Positives include: Reflux symptoms including a cough improved with Prilosec  A complete ROS was otherwise negative.  Physical Exam:  Blood pressure (!) 152/93, pulse 73, temperature 97.8 F (36.6 C), temperature source Oral, resp. rate 18, height 5\' 11"  (1.803 m), weight 216 lb 6.4 oz (98.2 kg), SpO2 98 %.  HEENT: Oropharynx without visible mass, neck without mass Lungs: Clear bilaterally Cardiac: Regular rate and rhythm Abdomen: No hepatosplenomegaly  Vascular: No leg edema Lymph nodes: No cervical, supraclavicular, axillary, or inguinal nodes Neurologic: Alert and oriented, the motor exam appears intact in the upper and lower  extremities bilaterally Skin: No rash Musculoskeletal: No spine tenderness   LAB:  CBC  Lab Results  Component Value Date   WBC 13.0 (H) 10/27/2021   HGB 16.0 10/27/2021   HCT 49.5 10/27/2021   MCV 93.8 10/27/2021   PLT 195 10/27/2021   NEUTROABS 4.8 10/27/2021    Blood smear: The platelets appear normal in number.  The red cell morphology is unremarkable.  The polychromasia is not increased.  There are an increased number of mononuclear white cells consistent with mature lymphocytes.  Some of the lymphocytes have increased cytoplasm, rare nucleolus.  No blasts or other young forms are seen.  Mature neutrophils.    CMP 10/05/2021-creatinine 1.08, BUN 21, total protein 6.6, albumin 4.3, bilirubin 0.5, alkaline phosphatase 88, AST 17, ALT 23    Assessment/Plan:   Lymphocytosis  Hypertension Glaucoma Gastroesophageal reflux Hyperlipidemia Arthritis    Disposition:   Joshua Copeland is referred for evaluation of lymphocytosis.  Joshua Copeland has mild lymphocytosis.  Joshua Copeland does not have anemia or thrombocytopenia.  The total white cell count has not changed significantly over the past 10 months.  The clinical presentation and peripheral blood smear are consistent with early stage CLL.  We will submit peripheral blood for flow cytometry to confirm the CLL diagnosis.  We will obtain quantitative immunoglobulin levels and a serum immunofixation.  Joshua Copeland appears asymptomatic from the CLL.  There is no indication for treatment.  We discussed indications for treating CLL including the development of systemic "B "symptoms, cytopenias, and bothersome lymphadenopathy.  Joshua Copeland understands the increased risk of infection in  patients with CLL.  Joshua Copeland should remain up-to-date on influenza, pneumonia, and COVID vaccines.  Joshua Copeland will seek medical attention for symptoms of an infection.  We will contact him when results from today's laboratory evaluation are available.  Joshua Copeland will return for an office visit and CBC in 9  months.    Betsy Coder, MD  10/31/2021, 2:32 PM

## 2021-11-01 DIAGNOSIS — M25551 Pain in right hip: Secondary | ICD-10-CM | POA: Diagnosis not present

## 2021-11-01 LAB — SURGICAL PATHOLOGY

## 2021-11-03 ENCOUNTER — Telehealth: Payer: Self-pay

## 2021-11-03 LAB — FLOW CYTOMETRY

## 2021-11-03 LAB — MULTIPLE MYELOMA PANEL, SERUM
Albumin SerPl Elph-Mcnc: 4 g/dL (ref 2.9–4.4)
Albumin/Glob SerPl: 1.5 (ref 0.7–1.7)
Alpha 1: 0.2 g/dL (ref 0.0–0.4)
Alpha2 Glob SerPl Elph-Mcnc: 0.7 g/dL (ref 0.4–1.0)
B-Globulin SerPl Elph-Mcnc: 1.3 g/dL (ref 0.7–1.3)
Gamma Glob SerPl Elph-Mcnc: 0.7 g/dL (ref 0.4–1.8)
Globulin, Total: 2.8 g/dL (ref 2.2–3.9)
IgA: 382 mg/dL (ref 61–437)
IgG (Immunoglobin G), Serum: 683 mg/dL (ref 603–1613)
IgM (Immunoglobulin M), Srm: 63 mg/dL (ref 20–172)
Total Protein ELP: 6.8 g/dL (ref 6.0–8.5)

## 2021-11-03 NOTE — Telephone Encounter (Signed)
Pt verbalized understanding.

## 2021-11-03 NOTE — Telephone Encounter (Signed)
-----   Message from Ladell Pier, MD sent at 11/01/2021  6:52 PM EST ----- ?Please call patient, the flow cytometry is consistent with a diagnosis of CLL, follow-up as scheduled ? ?

## 2021-11-09 DIAGNOSIS — H401131 Primary open-angle glaucoma, bilateral, mild stage: Secondary | ICD-10-CM | POA: Diagnosis not present

## 2021-11-09 DIAGNOSIS — H6981 Other specified disorders of Eustachian tube, right ear: Secondary | ICD-10-CM | POA: Diagnosis not present

## 2021-12-27 DIAGNOSIS — M25551 Pain in right hip: Secondary | ICD-10-CM | POA: Diagnosis not present

## 2021-12-27 DIAGNOSIS — M170 Bilateral primary osteoarthritis of knee: Secondary | ICD-10-CM | POA: Diagnosis not present

## 2021-12-29 DIAGNOSIS — Z1211 Encounter for screening for malignant neoplasm of colon: Secondary | ICD-10-CM | POA: Diagnosis not present

## 2021-12-29 DIAGNOSIS — R739 Hyperglycemia, unspecified: Secondary | ICD-10-CM | POA: Diagnosis not present

## 2021-12-29 DIAGNOSIS — D72820 Lymphocytosis (symptomatic): Secondary | ICD-10-CM | POA: Diagnosis not present

## 2021-12-29 DIAGNOSIS — M1610 Unilateral primary osteoarthritis, unspecified hip: Secondary | ICD-10-CM | POA: Diagnosis not present

## 2021-12-29 DIAGNOSIS — N401 Enlarged prostate with lower urinary tract symptoms: Secondary | ICD-10-CM | POA: Diagnosis not present

## 2021-12-29 DIAGNOSIS — Z125 Encounter for screening for malignant neoplasm of prostate: Secondary | ICD-10-CM | POA: Diagnosis not present

## 2021-12-29 DIAGNOSIS — Z0000 Encounter for general adult medical examination without abnormal findings: Secondary | ICD-10-CM | POA: Diagnosis not present

## 2021-12-29 DIAGNOSIS — E782 Mixed hyperlipidemia: Secondary | ICD-10-CM | POA: Diagnosis not present

## 2021-12-29 DIAGNOSIS — R351 Nocturia: Secondary | ICD-10-CM | POA: Diagnosis not present

## 2021-12-29 DIAGNOSIS — Z79899 Other long term (current) drug therapy: Secondary | ICD-10-CM | POA: Diagnosis not present

## 2021-12-29 DIAGNOSIS — I10 Essential (primary) hypertension: Secondary | ICD-10-CM | POA: Diagnosis not present

## 2022-01-19 DIAGNOSIS — M1712 Unilateral primary osteoarthritis, left knee: Secondary | ICD-10-CM | POA: Diagnosis not present

## 2022-01-25 DIAGNOSIS — M1712 Unilateral primary osteoarthritis, left knee: Secondary | ICD-10-CM | POA: Diagnosis not present

## 2022-02-01 DIAGNOSIS — M1712 Unilateral primary osteoarthritis, left knee: Secondary | ICD-10-CM | POA: Diagnosis not present

## 2022-02-06 DIAGNOSIS — Z96641 Presence of right artificial hip joint: Secondary | ICD-10-CM | POA: Diagnosis not present

## 2022-02-06 DIAGNOSIS — M1611 Unilateral primary osteoarthritis, right hip: Secondary | ICD-10-CM | POA: Diagnosis not present

## 2022-03-07 DIAGNOSIS — I10 Essential (primary) hypertension: Secondary | ICD-10-CM | POA: Diagnosis not present

## 2022-03-07 DIAGNOSIS — Z809 Family history of malignant neoplasm, unspecified: Secondary | ICD-10-CM | POA: Diagnosis not present

## 2022-03-07 DIAGNOSIS — E785 Hyperlipidemia, unspecified: Secondary | ICD-10-CM | POA: Diagnosis not present

## 2022-03-07 DIAGNOSIS — Z008 Encounter for other general examination: Secondary | ICD-10-CM | POA: Diagnosis not present

## 2022-03-07 DIAGNOSIS — H409 Unspecified glaucoma: Secondary | ICD-10-CM | POA: Diagnosis not present

## 2022-03-07 DIAGNOSIS — C9590 Leukemia, unspecified not having achieved remission: Secondary | ICD-10-CM | POA: Diagnosis not present

## 2022-03-07 DIAGNOSIS — Z6830 Body mass index (BMI) 30.0-30.9, adult: Secondary | ICD-10-CM | POA: Diagnosis not present

## 2022-03-07 DIAGNOSIS — M1990 Unspecified osteoarthritis, unspecified site: Secondary | ICD-10-CM | POA: Diagnosis not present

## 2022-03-07 DIAGNOSIS — N400 Enlarged prostate without lower urinary tract symptoms: Secondary | ICD-10-CM | POA: Diagnosis not present

## 2022-03-07 DIAGNOSIS — Z7982 Long term (current) use of aspirin: Secondary | ICD-10-CM | POA: Diagnosis not present

## 2022-03-07 DIAGNOSIS — I951 Orthostatic hypotension: Secondary | ICD-10-CM | POA: Diagnosis not present

## 2022-03-07 DIAGNOSIS — K219 Gastro-esophageal reflux disease without esophagitis: Secondary | ICD-10-CM | POA: Diagnosis not present

## 2022-03-07 DIAGNOSIS — E669 Obesity, unspecified: Secondary | ICD-10-CM | POA: Diagnosis not present

## 2022-03-14 DIAGNOSIS — H903 Sensorineural hearing loss, bilateral: Secondary | ICD-10-CM | POA: Diagnosis not present

## 2022-03-15 ENCOUNTER — Other Ambulatory Visit: Payer: Self-pay | Admitting: Physician Assistant

## 2022-03-15 DIAGNOSIS — H903 Sensorineural hearing loss, bilateral: Secondary | ICD-10-CM

## 2022-03-20 DIAGNOSIS — Z5189 Encounter for other specified aftercare: Secondary | ICD-10-CM | POA: Diagnosis not present

## 2022-03-25 ENCOUNTER — Ambulatory Visit
Admission: RE | Admit: 2022-03-25 | Discharge: 2022-03-25 | Disposition: A | Discharge status: Home / Self Care | Payer: Medicare HMO | Source: Ambulatory Visit | Attending: Physician Assistant | Admitting: Physician Assistant

## 2022-03-25 ENCOUNTER — Other Ambulatory Visit: Payer: Self-pay | Admitting: Physician Assistant

## 2022-03-25 DIAGNOSIS — H903 Sensorineural hearing loss, bilateral: Secondary | ICD-10-CM

## 2022-03-25 DIAGNOSIS — Z77018 Contact with and (suspected) exposure to other hazardous metals: Secondary | ICD-10-CM

## 2022-04-03 ENCOUNTER — Ambulatory Visit
Admission: RE | Admit: 2022-04-03 | Discharge: 2022-04-03 | Disposition: A | Discharge status: Home / Self Care | Payer: Medicare HMO | Source: Ambulatory Visit | Attending: Physician Assistant | Admitting: Physician Assistant

## 2022-04-03 DIAGNOSIS — I6782 Cerebral ischemia: Secondary | ICD-10-CM | POA: Diagnosis not present

## 2022-04-03 DIAGNOSIS — Z135 Encounter for screening for eye and ear disorders: Secondary | ICD-10-CM | POA: Diagnosis not present

## 2022-04-03 DIAGNOSIS — H9042 Sensorineural hearing loss, unilateral, left ear, with unrestricted hearing on the contralateral side: Secondary | ICD-10-CM | POA: Diagnosis not present

## 2022-04-03 DIAGNOSIS — Z77018 Contact with and (suspected) exposure to other hazardous metals: Secondary | ICD-10-CM

## 2022-04-03 MED ORDER — GADOBENATE DIMEGLUMINE 529 MG/ML IV SOLN
20.0000 mL | Freq: Once | INTRAVENOUS | Status: AC | PRN
  Administered 2022-04-03: 20 mL via INTRAVENOUS

## 2022-04-04 DIAGNOSIS — H903 Sensorineural hearing loss, bilateral: Secondary | ICD-10-CM | POA: Diagnosis not present

## 2022-04-06 DIAGNOSIS — N401 Enlarged prostate with lower urinary tract symptoms: Secondary | ICD-10-CM | POA: Diagnosis not present

## 2022-04-24 DIAGNOSIS — I10 Essential (primary) hypertension: Secondary | ICD-10-CM | POA: Diagnosis not present

## 2022-04-24 DIAGNOSIS — N401 Enlarged prostate with lower urinary tract symptoms: Secondary | ICD-10-CM | POA: Diagnosis not present

## 2022-04-24 DIAGNOSIS — R9089 Other abnormal findings on diagnostic imaging of central nervous system: Secondary | ICD-10-CM | POA: Diagnosis not present

## 2022-05-16 DIAGNOSIS — H401131 Primary open-angle glaucoma, bilateral, mild stage: Secondary | ICD-10-CM | POA: Diagnosis not present

## 2022-05-25 DIAGNOSIS — R351 Nocturia: Secondary | ICD-10-CM | POA: Diagnosis not present

## 2022-05-25 DIAGNOSIS — R972 Elevated prostate specific antigen [PSA]: Secondary | ICD-10-CM | POA: Diagnosis not present

## 2022-05-25 DIAGNOSIS — R3912 Poor urinary stream: Secondary | ICD-10-CM | POA: Diagnosis not present

## 2022-05-25 DIAGNOSIS — N401 Enlarged prostate with lower urinary tract symptoms: Secondary | ICD-10-CM | POA: Diagnosis not present

## 2022-05-28 ENCOUNTER — Other Ambulatory Visit: Payer: Self-pay | Admitting: Urology

## 2022-05-28 DIAGNOSIS — R972 Elevated prostate specific antigen [PSA]: Secondary | ICD-10-CM

## 2022-05-30 ENCOUNTER — Other Ambulatory Visit (HOSPITAL_COMMUNITY): Payer: Self-pay | Admitting: Urology

## 2022-05-30 DIAGNOSIS — R972 Elevated prostate specific antigen [PSA]: Secondary | ICD-10-CM

## 2022-07-02 ENCOUNTER — Ambulatory Visit (HOSPITAL_COMMUNITY)
Admission: RE | Admit: 2022-07-02 | Discharge: 2022-07-02 | Disposition: A | Discharge status: Home / Self Care | Payer: Medicare HMO | Source: Ambulatory Visit | Attending: Urology | Admitting: Urology

## 2022-07-02 DIAGNOSIS — R972 Elevated prostate specific antigen [PSA]: Secondary | ICD-10-CM | POA: Insufficient documentation

## 2022-07-02 MED ORDER — GADOBUTROL 1 MMOL/ML IV SOLN
9.8000 mL | Freq: Once | INTRAVENOUS | Status: AC | PRN
  Administered 2022-07-02: 9.8 mL via INTRAVENOUS

## 2022-07-03 DIAGNOSIS — M1610 Unilateral primary osteoarthritis, unspecified hip: Secondary | ICD-10-CM | POA: Diagnosis not present

## 2022-07-03 DIAGNOSIS — I10 Essential (primary) hypertension: Secondary | ICD-10-CM | POA: Diagnosis not present

## 2022-07-03 DIAGNOSIS — E782 Mixed hyperlipidemia: Secondary | ICD-10-CM | POA: Diagnosis not present

## 2022-07-30 ENCOUNTER — Inpatient Hospital Stay: Payer: Medicare HMO | Attending: Oncology

## 2022-07-30 ENCOUNTER — Inpatient Hospital Stay: Payer: Medicare HMO | Admitting: Oncology

## 2022-07-30 VITALS — BP 128/86 | HR 69 | Temp 98.2°F | Resp 18 | Ht 71.0 in | Wt 219.6 lb

## 2022-07-30 DIAGNOSIS — H409 Unspecified glaucoma: Secondary | ICD-10-CM | POA: Insufficient documentation

## 2022-07-30 DIAGNOSIS — C9110 Chronic lymphocytic leukemia of B-cell type not having achieved remission: Secondary | ICD-10-CM | POA: Insufficient documentation

## 2022-07-30 DIAGNOSIS — I10 Essential (primary) hypertension: Secondary | ICD-10-CM | POA: Insufficient documentation

## 2022-07-30 DIAGNOSIS — D72829 Elevated white blood cell count, unspecified: Secondary | ICD-10-CM | POA: Diagnosis not present

## 2022-07-30 DIAGNOSIS — K219 Gastro-esophageal reflux disease without esophagitis: Secondary | ICD-10-CM | POA: Diagnosis not present

## 2022-07-30 DIAGNOSIS — M1990 Unspecified osteoarthritis, unspecified site: Secondary | ICD-10-CM | POA: Diagnosis not present

## 2022-07-30 DIAGNOSIS — E785 Hyperlipidemia, unspecified: Secondary | ICD-10-CM | POA: Diagnosis not present

## 2022-07-30 LAB — CBC WITH DIFFERENTIAL (CANCER CENTER ONLY)
Abs Immature Granulocytes: 0.01 10*3/uL (ref 0.00–0.07)
Basophils Absolute: 0.1 10*3/uL (ref 0.0–0.1)
Basophils Relative: 1 %
Eosinophils Absolute: 0.2 10*3/uL (ref 0.0–0.5)
Eosinophils Relative: 2 %
HCT: 47.5 % (ref 39.0–52.0)
Hemoglobin: 15.7 g/dL (ref 13.0–17.0)
Immature Granulocytes: 0 %
Lymphocytes Relative: 57 %
Lymphs Abs: 5.7 10*3/uL — ABNORMAL HIGH (ref 0.7–4.0)
MCH: 30.6 pg (ref 26.0–34.0)
MCHC: 33.1 g/dL (ref 30.0–36.0)
MCV: 92.6 fL (ref 80.0–100.0)
Monocytes Absolute: 0.5 10*3/uL (ref 0.1–1.0)
Monocytes Relative: 5 %
Neutro Abs: 3.4 10*3/uL (ref 1.7–7.7)
Neutrophils Relative %: 35 %
Platelet Count: 190 10*3/uL (ref 150–400)
RBC: 5.13 MIL/uL (ref 4.22–5.81)
RDW: 13.8 % (ref 11.5–15.5)
WBC Count: 9.8 10*3/uL (ref 4.0–10.5)
nRBC: 0 % (ref 0.0–0.2)

## 2022-07-30 NOTE — Progress Notes (Signed)
  [facility] OFFICE PROGRESS NOTE   Diagnosis: CLL  INTERVAL HISTORY:   [name] returns as scheduled.  Feels well.  Good appetite.  No fever or night sweats.  No complaint.  He reports being up-to-date on influenza, COVID-19, and pneumonia vaccines.  Objective:  Vital signs in last 24 hours:  Blood pressure 128/86, pulse 69, temperature 98.2 F (36.8 C), temperature source Oral, resp. rate 18, height '5\' 11"'$  (1.803 m), weight 219 lb 9.6 oz (99.6 kg), SpO2 97 %.   Lymphatics: No cervical, supraclavicular, axillary, or inguinal nodes Resp: Lungs clear bilaterally distant breath sounds Cardio: Regular rate and rhythm GI: No hepatosplenomegaly Vascular: No leg edema  Lab Results:  Lab Results  Component Value Date   WBC 9.8 07/30/2022   HGB 15.7 07/30/2022   HCT 47.5 07/30/2022   MCV 92.6 07/30/2022   PLT 190 07/30/2022   NEUTROABS 3.4 07/30/2022    CMP  Lab Results  Component Value Date   NA 141 02/01/2020   K 4.3 02/01/2020   CL 105 02/01/2020   CO2 23 02/01/2020   GLUCOSE 139 (H) 02/01/2020   BUN 24 (H) 02/01/2020   CREATININE 1.74 (H) 02/01/2020   CALCIUM 9.2 02/01/2020   PROT 7.1 02/01/2020   ALBUMIN 4.6 02/01/2020   AST 18 02/01/2020   ALT 23 02/01/2020   ALKPHOS 68 02/01/2020   BILITOT 0.8 02/01/2020   GFRNONAA 41 (L) 02/01/2020   GFRAA 47 (L) 02/01/2020    No results found for: "CEA1", "CEA", "XNT700", "CA125"  No results found for: "INR", "LABPROT"  Imaging:  No results found.  Medications: I have reviewed the patient's current medications.   Assessment/Plan: Lymphocytosis-CLL Peripheral blood flow cytometry 10/23/2021-monoclonal B-cell population, CD5, CD19, CD20, CD38, CD200, and lambda positive  Hypertension Glaucoma Gastroesophageal reflux Hyperlipidemia Arthritis    Disposition: Mr Joshua Copeland has mild lymphocytosis.  Peripheral blood flow cytometry in February was consistent with CLL.  He has early stage CLL.  There  is no indication for treating the CLL at present.  He understands the increased risk of infection in patients with CLL.  He will remain up-to-date on influenza and pneumonia vaccines.  He will call for new symptoms. Mr Joshua Copeland will return for an office visit in 1 year.  Betsy Coder, MD  07/30/2022  9:57 AM

## 2022-08-09 DIAGNOSIS — M1712 Unilateral primary osteoarthritis, left knee: Secondary | ICD-10-CM | POA: Diagnosis not present

## 2022-08-10 DIAGNOSIS — R972 Elevated prostate specific antigen [PSA]: Secondary | ICD-10-CM | POA: Diagnosis not present

## 2022-08-16 DIAGNOSIS — M1712 Unilateral primary osteoarthritis, left knee: Secondary | ICD-10-CM | POA: Diagnosis not present

## 2022-08-23 DIAGNOSIS — M1712 Unilateral primary osteoarthritis, left knee: Secondary | ICD-10-CM | POA: Diagnosis not present

## 2022-09-26 DIAGNOSIS — C9510 Chronic leukemia of unspecified cell type not having achieved remission: Secondary | ICD-10-CM | POA: Diagnosis not present

## 2022-09-26 DIAGNOSIS — Z6830 Body mass index (BMI) 30.0-30.9, adult: Secondary | ICD-10-CM | POA: Diagnosis not present

## 2022-09-26 DIAGNOSIS — E785 Hyperlipidemia, unspecified: Secondary | ICD-10-CM | POA: Diagnosis not present

## 2022-09-26 DIAGNOSIS — Z96641 Presence of right artificial hip joint: Secondary | ICD-10-CM | POA: Diagnosis not present

## 2022-09-26 DIAGNOSIS — H409 Unspecified glaucoma: Secondary | ICD-10-CM | POA: Diagnosis not present

## 2022-09-26 DIAGNOSIS — N400 Enlarged prostate without lower urinary tract symptoms: Secondary | ICD-10-CM | POA: Diagnosis not present

## 2022-09-26 DIAGNOSIS — K219 Gastro-esophageal reflux disease without esophagitis: Secondary | ICD-10-CM | POA: Diagnosis not present

## 2022-09-26 DIAGNOSIS — K08409 Partial loss of teeth, unspecified cause, unspecified class: Secondary | ICD-10-CM | POA: Diagnosis not present

## 2022-09-26 DIAGNOSIS — E669 Obesity, unspecified: Secondary | ICD-10-CM | POA: Diagnosis not present

## 2022-09-26 DIAGNOSIS — M1990 Unspecified osteoarthritis, unspecified site: Secondary | ICD-10-CM | POA: Diagnosis not present

## 2022-09-26 DIAGNOSIS — Z809 Family history of malignant neoplasm, unspecified: Secondary | ICD-10-CM | POA: Diagnosis not present

## 2022-09-26 DIAGNOSIS — I10 Essential (primary) hypertension: Secondary | ICD-10-CM | POA: Diagnosis not present

## 2022-10-11 DIAGNOSIS — M1712 Unilateral primary osteoarthritis, left knee: Secondary | ICD-10-CM | POA: Diagnosis not present

## 2022-11-12 DIAGNOSIS — R69 Illness, unspecified: Secondary | ICD-10-CM | POA: Diagnosis not present

## 2023-01-09 DIAGNOSIS — I10 Essential (primary) hypertension: Secondary | ICD-10-CM | POA: Diagnosis not present

## 2023-01-09 DIAGNOSIS — I700 Atherosclerosis of aorta: Secondary | ICD-10-CM | POA: Diagnosis not present

## 2023-01-09 DIAGNOSIS — R739 Hyperglycemia, unspecified: Secondary | ICD-10-CM | POA: Diagnosis not present

## 2023-01-09 DIAGNOSIS — Z125 Encounter for screening for malignant neoplasm of prostate: Secondary | ICD-10-CM | POA: Diagnosis not present

## 2023-01-09 DIAGNOSIS — D72820 Lymphocytosis (symptomatic): Secondary | ICD-10-CM | POA: Diagnosis not present

## 2023-01-09 DIAGNOSIS — C9110 Chronic lymphocytic leukemia of B-cell type not having achieved remission: Secondary | ICD-10-CM | POA: Diagnosis not present

## 2023-01-09 DIAGNOSIS — N401 Enlarged prostate with lower urinary tract symptoms: Secondary | ICD-10-CM | POA: Diagnosis not present

## 2023-01-09 DIAGNOSIS — Z79899 Other long term (current) drug therapy: Secondary | ICD-10-CM | POA: Diagnosis not present

## 2023-01-09 DIAGNOSIS — Z1211 Encounter for screening for malignant neoplasm of colon: Secondary | ICD-10-CM | POA: Diagnosis not present

## 2023-01-09 DIAGNOSIS — Z0000 Encounter for general adult medical examination without abnormal findings: Secondary | ICD-10-CM | POA: Diagnosis not present

## 2023-01-09 DIAGNOSIS — E782 Mixed hyperlipidemia: Secondary | ICD-10-CM | POA: Diagnosis not present

## 2023-01-10 DIAGNOSIS — Z1211 Encounter for screening for malignant neoplasm of colon: Secondary | ICD-10-CM | POA: Diagnosis not present

## 2023-01-14 ENCOUNTER — Other Ambulatory Visit (HOSPITAL_BASED_OUTPATIENT_CLINIC_OR_DEPARTMENT_OTHER): Payer: Self-pay | Admitting: Family Medicine

## 2023-01-14 DIAGNOSIS — E785 Hyperlipidemia, unspecified: Secondary | ICD-10-CM

## 2023-01-16 DIAGNOSIS — R972 Elevated prostate specific antigen [PSA]: Secondary | ICD-10-CM | POA: Diagnosis not present

## 2023-01-16 DIAGNOSIS — N401 Enlarged prostate with lower urinary tract symptoms: Secondary | ICD-10-CM | POA: Diagnosis not present

## 2023-01-16 DIAGNOSIS — R3912 Poor urinary stream: Secondary | ICD-10-CM | POA: Diagnosis not present

## 2023-02-12 ENCOUNTER — Ambulatory Visit (HOSPITAL_COMMUNITY)
Admission: RE | Admit: 2023-02-12 | Discharge: 2023-02-12 | Disposition: A | Discharge status: Home / Self Care | Payer: Self-pay | Source: Ambulatory Visit | Attending: Family Medicine | Admitting: Family Medicine

## 2023-02-12 DIAGNOSIS — E785 Hyperlipidemia, unspecified: Secondary | ICD-10-CM | POA: Insufficient documentation

## 2023-02-13 DIAGNOSIS — H401131 Primary open-angle glaucoma, bilateral, mild stage: Secondary | ICD-10-CM | POA: Diagnosis not present

## 2023-02-22 ENCOUNTER — Telehealth: Payer: Self-pay | Admitting: Internal Medicine

## 2023-02-22 NOTE — Telephone Encounter (Signed)
    Primary Cardiologist:None  Chart reviewed as part of pre-operative protocol coverage. Because of Domenico Achord past medical history and time since last visit, he/she will require a follow-up visit in order to better assess preoperative cardiovascular risk.  Pre-op covering staff: - Please schedule  Office appointment and call patient to inform them. - Please contact requesting surgeon's office via preferred method (i.e, phone, fax) to inform them of need for appointment prior to surgery.  If applicable, this message will also be routed to pharmacy pool and/or primary cardiologist for input on holding anticoagulant/antiplatelet agent as requested below so that this information is available at time of patient's appointment.   Ronney Asters, NP  02/22/2023, 4:30 PM

## 2023-02-22 NOTE — Telephone Encounter (Signed)
Pt has appt 03/15/23 with Dr. Jenene Slicker. Ok per per op APP defer clearance to appt with MD. I will update all parties involved.

## 2023-02-22 NOTE — Telephone Encounter (Signed)
   Pre-operative Risk Assessment    Patient Name: Joshua Copeland  DOB: Mar 15, 1956 MRN: [id number]      Request for Surgical Clearance    Procedure:   LT Total Knee Arthroplasty   Date of Surgery:  Clearance TBD                                 Surgeon:  Dr. Ollen Gross Surgeon's Group or Practice Name:  [facility]  Phone number:  [phone] Fax number:  [phone]   Type of Clearance Requested:   - Medical    Type of Anesthesia:   choice   Additional requests/questions:    Lajuana Matte   02/22/2023, 4:16 PM

## 2023-02-23 ENCOUNTER — Encounter: Payer: Self-pay | Admitting: Internal Medicine

## 2023-02-27 ENCOUNTER — Ambulatory Visit: Payer: Medicare HMO | Attending: Internal Medicine | Admitting: Cardiology

## 2023-02-27 ENCOUNTER — Encounter: Payer: Self-pay | Admitting: Cardiology

## 2023-02-27 VITALS — BP 119/80 | HR 76 | Ht 71.0 in | Wt 212.8 lb

## 2023-02-27 DIAGNOSIS — I2510 Atherosclerotic heart disease of native coronary artery without angina pectoris: Secondary | ICD-10-CM | POA: Diagnosis not present

## 2023-02-27 DIAGNOSIS — R0602 Shortness of breath: Secondary | ICD-10-CM

## 2023-02-27 DIAGNOSIS — I10 Essential (primary) hypertension: Secondary | ICD-10-CM | POA: Diagnosis not present

## 2023-02-27 DIAGNOSIS — Z01818 Encounter for other preprocedural examination: Secondary | ICD-10-CM | POA: Diagnosis not present

## 2023-02-27 DIAGNOSIS — E785 Hyperlipidemia, unspecified: Secondary | ICD-10-CM | POA: Diagnosis not present

## 2023-02-27 MED ORDER — ASPIRIN 81 MG PO TBEC: 81.0000 mg | DELAYED_RELEASE_TABLET | Freq: Every day | ORAL | 12 refills | Status: DC

## 2023-02-27 NOTE — Progress Notes (Signed)
Cardiology Office Note:    Date:  02/27/2023   ID:  Joshua Copeland, DOB August 06, 1956, MRN [id number]  PCP:  Shon Hale, MD  Cardiologist:  None  Electrophysiologist:  None   Referring MD: Shon Hale, *   Chief Complaint  Patient presents with   Coronary Artery Disease    History of Present Illness:    Joshua Copeland is a 67 y.o. male with a hx of hyperlipidemia, hypertension who is referred by Dr. Chanetta Marshall for preop evaluation.  Planning knee replacement.  Denies any chest pain, lightheadedness, syncope, lower extremity edema, or palpitations.  Does report some dyspnea with heavy exertion, occurs about 30 minutes into his workouts.  He typically rides bike 3 times per week for 45 minutes, in addition to going to the gym twice per week.  Can walk up 2-3 flights of stairs without stopping.  No smoking history.  No family history of heart disease.  Calcium score 906 (87th percentile) on 02/12/2023.  Also noted to have mild ascending aortic dilatation measuring 40 mm.    No past medical history on file.  No past surgical history on file.  Current Medications: Current Meds  Medication Sig   aspirin EC 81 MG tablet Take 1 tablet (81 mg total) by mouth daily. Swallow whole.   latanoprost (XALATAN) 0.005 % ophthalmic solution Place 1 drop into both eyes daily.    omeprazole (PRILOSEC) 20 MG capsule Take 20 mg by mouth every morning.   rosuvastatin (CRESTOR) 10 MG tablet Take 10 mg by mouth daily.   tamsulosin (FLOMAX) 0.4 MG CAPS capsule Take 0.4 mg by mouth daily.   timolol (TIMOPTIC) 0.25 % ophthalmic solution Place 1 drop into both eyes daily.   valsartan (DIOVAN) 160 MG tablet Take 160 mg by mouth daily.     Allergies:   Patient has no known allergies.   Social History   Socioeconomic History   Marital status: Married    Spouse name: Not on file   Number of children: Not on file   Years of education: Not on file   Highest education level: Not on file   Occupational History   Not on file  Tobacco Use   Smoking status: Never   Smokeless tobacco: Never  Substance and Sexual Activity   Alcohol use: Not on file   Drug use: Not on file   Sexual activity: Not on file  Other Topics Concern   Not on file  Social History Narrative   Not on file   Social Determinants of Health   Financial Resource Strain: Not on file  Food Insecurity: Not on file  Transportation Needs: Not on file  Physical Activity: Not on file  Stress: Not on file  Social Connections: Not on file     Family History: No family history of heart disease.  ROS:   Please see the history of present illness.     All other systems reviewed and are negative.  EKGs/Labs/Other Studies Reviewed:    The following studies were reviewed today:   EKG:  02/27/2023: Normal sinus rhythm, rate 76, Q waves in leads III, aVF  Recent Labs: 07/30/2022: Hemoglobin 15.7; Platelet Count 190  Recent Lipid Panel No results found for: "CHOL", "TRIG", "HDL", "CHOLHDL", "VLDL", "LDLCALC", "LDLDIRECT"  Physical Exam:    VS:  BP 119/80   Pulse 76   Ht 5\' 11"  (1.803 m)   Wt 212 lb 12.8 oz (96.5 kg)   SpO2 94%   BMI 29.68 kg/m  Wt Readings from Last 3 Encounters:  02/27/23 212 lb 12.8 oz (96.5 kg)  07/30/22 219 lb 9.6 oz (99.6 kg)  10/31/21 216 lb 6.4 oz (98.2 kg)     GEN:  Well nourished, well developed in no acute distress HEENT: Normal NECK: No JVD; No carotid bruits LYMPHATICS: No lymphadenopathy CARDIAC: RRR, no murmurs, rubs, gallops RESPIRATORY:  Clear to auscultation without rales, wheezing or rhonchi  ABDOMEN: Soft, non-tender, non-distended MUSCULOSKELETAL:  No edema; No deformity  SKIN: Warm and dry NEUROLOGIC:  Alert and oriented x 3 PSYCHIATRIC:  Normal affect   ASSESSMENT:    1. Preoperative clearance   2. Coronary artery disease involving native coronary artery of native heart, unspecified whether angina present   3. Shortness of breath   4.  Essential hypertension   5. Hyperlipidemia, unspecified hyperlipidemia type    PLAN:    Preop evaluation: Prior to knee surgery.  Excellent functional capacity.  Does report has been having dyspnea with significant exertion.  Significantly elevated calcium score (906 on 02/12/2023).  Recommend Lexiscan Myoview to rule out ischemia prior to surgery.  Check echocardiogram to rule out structural heart disease  CAD: Calcium score 906 (87th percentile) on 02/12/2023.  Reports dyspnea with significant exertion -Check Lexiscan Myoview as above -Start aspirin 81 mg daily -Started on rosuvastatin 10 mg daily  Aortic dilatation: Mild ascending aortic dilatation measuring 40 mm.  Will monitor  Hypertension: On valsartan 160 mg daily.  Appears controlled  Hyperlipidemia: On pravastatin 20 mg daily, LDL 148 on 01/09/2023.  Switched to rosuvastatin 10 mg daily.  Check fasting lipid panel in 2 months  RTC in 4 months  Informed Consent   Shared Decision Making/Informed Consent The risks [chest pain, shortness of breath, cardiac arrhythmias, dizziness, blood pressure fluctuations, myocardial infarction, stroke/transient ischemic attack, nausea, vomiting, allergic reaction, radiation exposure, metallic taste sensation and life-threatening complications (estimated to be 1 in 10,000)], benefits (risk stratification, diagnosing coronary artery disease, treatment guidance) and alternatives of a nuclear stress test were discussed in detail with Mr. Verdejo and he agrees to proceed.       Medication Adjustments/Labs and Tests Ordered: Current medicines are reviewed at length with the patient today.  Concerns regarding medicines are outlined above.  Orders Placed This Encounter  Procedures   Lipid panel   MYOCARDIAL PERFUSION IMAGING   EKG 12-Lead   ECHOCARDIOGRAM COMPLETE   Meds ordered this encounter  Medications   aspirin EC 81 MG tablet    Sig: Take 1 tablet (81 mg total) by mouth daily. Swallow whole.     Dispense:  30 tablet    Refill:  12    Patient Instructions  Medication Instructions:  Start Aspirin 81 mg daily *If you need a refill on your cardiac medications before your next appointment, please call your pharmacy*   Lab Work: Lipid panel- Please return for Blood Work in 2 months. No appointment needed, lab here at the office is open Monday-Friday from 8AM to 4PM and closed daily for lunch from 12:45-1:45.   If you have labs (blood work) drawn today and your tests are completely normal, you will receive your results only by: MyChart Message (if you have MyChart) OR A paper copy in the mail If you have any lab test that is abnormal or we need to change your treatment, we will call you to review the results.   Testing/Procedures: Your physician has requested that you have an echocardiogram. Echocardiography is a painless test that uses sound waves  to create images of your heart. It provides your doctor with information about the size and shape of your heart and how well your heart's chambers and valves are working. This procedure takes approximately one hour. There are no restrictions for this procedure. Please do NOT wear cologne, perfume, aftershave, or lotions (deodorant is allowed). Please arrive 15 minutes prior to your appointment time.   Dr Bjorn Pippin has ordered a Lexiscan Myocardial Perfusion Imaging Study.  Please arrive 15 minutes prior to your appointment time for registration and insurance purposes.   The test will take approximately 3 to 4 hours to complete; you may bring reading material.  If someone comes with you to your appointment, they will need to remain in the main lobby due to limited space in the testing area. **If you are pregnant or breastfeeding, please notify the nuclear lab prior to your appointment**   How to prepare for your Myocardial Perfusion Test: Do not eat or drink 3 hours prior to your test, except you may have water. Do not consume products  containing caffeine (regular or decaffeinated) 12 hours prior to your test. (ex: coffee, chocolate, sodas, tea). Do wear comfortable clothes (no dresses or overalls) and walking shoes, tennis shoes preferred (No heels or open toe shoes are allowed). Do NOT wear cologne, perfume, aftershave, or lotions (deodorant is allowed). If you use an inhaler, use it the AM of your test and bring it with you.  If you use a nebulizer, use it the AM of your test.  If these instructions are not followed, your test will have to be rescheduled.    Follow-Up: At [facility], you and your health needs are our priority.  As part of our continuing mission to provide you with exceptional heart care, we have created designated Provider Care Teams.  These Care Teams include your primary Cardiologist (physician) and Advanced Practice Providers (APPs -  Physician Assistants and Nurse Practitioners) who all work together to provide you with the care you need, when you need it.  We recommend signing up for the patient portal called "MyChart".  Sign up information is provided on this After Visit Summary.  MyChart is used to connect with patients for Virtual Visits (Telemedicine).  Patients are able to view lab/test results, encounter notes, upcoming appointments, etc.  Non-urgent messages can be sent to your provider as well.   To learn more about what you can do with MyChart, go to [email or web address].    Your next appointment:   4 month(s)  Provider:   Dr Bjorn Pippin    Signed, Little Ishikawa, MD  02/27/2023 5:37 PM    [facility]

## 2023-02-27 NOTE — Patient Instructions (Signed)
Medication Instructions:  Start Aspirin 81 mg daily *If you need a refill on your cardiac medications before your next appointment, please call your pharmacy*   Lab Work: Lipid panel- Please return for Blood Work in 2 months. No appointment needed, lab here at the office is open Monday-Friday from 8AM to 4PM and closed daily for lunch from 12:45-1:45.   If you have labs (blood work) drawn today and your tests are completely normal, you will receive your results only by: MyChart Message (if you have MyChart) OR A paper copy in the mail If you have any lab test that is abnormal or we need to change your treatment, we will call you to review the results.   Testing/Procedures: Your physician has requested that you have an echocardiogram. Echocardiography is a painless test that uses sound waves to create images of your heart. It provides your doctor with information about the size and shape of your heart and how well your heart's chambers and valves are working. This procedure takes approximately one hour. There are no restrictions for this procedure. Please do NOT wear cologne, perfume, aftershave, or lotions (deodorant is allowed). Please arrive 15 minutes prior to your appointment time.   Dr Bjorn Pippin has ordered a Lexiscan Myocardial Perfusion Imaging Study.  Please arrive 15 minutes prior to your appointment time for registration and insurance purposes.   The test will take approximately 3 to 4 hours to complete; you may bring reading material.  If someone comes with you to your appointment, they will need to remain in the main lobby due to limited space in the testing area. **If you are pregnant or breastfeeding, please notify the nuclear lab prior to your appointment**   How to prepare for your Myocardial Perfusion Test: Do not eat or drink 3 hours prior to your test, except you may have water. Do not consume products containing caffeine (regular or decaffeinated) 12 hours prior to your  test. (ex: coffee, chocolate, sodas, tea). Do wear comfortable clothes (no dresses or overalls) and walking shoes, tennis shoes preferred (No heels or open toe shoes are allowed). Do NOT wear cologne, perfume, aftershave, or lotions (deodorant is allowed). If you use an inhaler, use it the AM of your test and bring it with you.  If you use a nebulizer, use it the AM of your test.  If these instructions are not followed, your test will have to be rescheduled.    Follow-Up: At [facility], you and your health needs are our priority.  As part of our continuing mission to provide you with exceptional heart care, we have created designated Provider Care Teams.  These Care Teams include your primary Cardiologist (physician) and Advanced Practice Providers (APPs -  Physician Assistants and Nurse Practitioners) who all work together to provide you with the care you need, when you need it.  We recommend signing up for the patient portal called "MyChart".  Sign up information is provided on this After Visit Summary.  MyChart is used to connect with patients for Virtual Visits (Telemedicine).  Patients are able to view lab/test results, encounter notes, upcoming appointments, etc.  Non-urgent messages can be sent to your provider as well.   To learn more about what you can do with MyChart, go to [email or web address].    Your next appointment:   4 month(s)  Provider:   Dr Bjorn Pippin

## 2023-03-01 ENCOUNTER — Other Ambulatory Visit (HOSPITAL_COMMUNITY): Payer: Medicare HMO

## 2023-03-05 ENCOUNTER — Telehealth (HOSPITAL_COMMUNITY): Payer: Self-pay | Admitting: *Deleted

## 2023-03-05 NOTE — Telephone Encounter (Signed)
Pt reached and given instructions for MPI study. 

## 2023-03-08 ENCOUNTER — Ambulatory Visit (HOSPITAL_BASED_OUTPATIENT_CLINIC_OR_DEPARTMENT_OTHER): Payer: Medicare HMO

## 2023-03-08 ENCOUNTER — Ambulatory Visit (HOSPITAL_COMMUNITY): Payer: Medicare HMO | Attending: Cardiology

## 2023-03-08 DIAGNOSIS — R0602 Shortness of breath: Secondary | ICD-10-CM | POA: Insufficient documentation

## 2023-03-08 DIAGNOSIS — I517 Cardiomegaly: Secondary | ICD-10-CM | POA: Diagnosis not present

## 2023-03-08 DIAGNOSIS — Z01818 Encounter for other preprocedural examination: Secondary | ICD-10-CM | POA: Diagnosis not present

## 2023-03-08 DIAGNOSIS — I361 Nonrheumatic tricuspid (valve) insufficiency: Secondary | ICD-10-CM | POA: Diagnosis not present

## 2023-03-08 LAB — MYOCARDIAL PERFUSION IMAGING
LV dias vol: 90 mL (ref 62–150)
LV sys vol: 33 mL
Nuc Stress EF: 64 %
Peak HR: 84 {beats}/min
Rest HR: 44 {beats}/min
Rest Nuclear Isotope Dose: 11 mCi
SDS: 0
SRS: 0
SSS: 0
ST Depression (mm): 0 mm
Stress Nuclear Isotope Dose: 31 mCi
TID: 0.95

## 2023-03-08 LAB — ECHOCARDIOGRAM COMPLETE
Area-P 1/2: 3.6 cm2
Height: 71 in
S' Lateral: 3.3 cm
Weight: 3392 oz

## 2023-03-08 MED ORDER — TECHNETIUM TC 99M TETROFOSMIN IV KIT
11.0000 | PACK | Freq: Once | INTRAVENOUS | Status: AC | PRN
  Administered 2023-03-08: 11 via INTRAVENOUS

## 2023-03-08 MED ORDER — TECHNETIUM TC 99M TETROFOSMIN IV KIT
31.0000 | PACK | Freq: Once | INTRAVENOUS | Status: AC | PRN
  Administered 2023-03-08: 31 via INTRAVENOUS

## 2023-03-08 MED ORDER — REGADENOSON 0.4 MG/5ML IV SOLN
0.40 mg | Freq: Once | INTRAVENOUS | Status: AC
Start: 2023-03-08 — End: 2023-03-08
  Administered 2023-03-08: 0.4 mg via INTRAVENOUS

## 2023-03-11 ENCOUNTER — Telehealth: Payer: Self-pay | Admitting: Cardiology

## 2023-03-11 NOTE — Telephone Encounter (Signed)
Joshua Ishikawa, MD 03/10/2023 10:17 PM EDT     Normal stress test, no further testing recommended prior to his surgery    Joshua Ishikawa, MD 03/10/2023 10:16 PM EDT     No significant abnormalities

## 2023-03-11 NOTE — Telephone Encounter (Signed)
Pt is requesting a call from nurse Stanton Kidney regarding his results and message he received on MyChart from her. Please advise

## 2023-03-11 NOTE — Telephone Encounter (Signed)
Spoke with patient. Explained his calcium score results, echo and stress test results. He said he had a blockage and was under the impression that these tests would tell him if he needs to just start a statin or have additional testing. Informed him that calc score test is screening, non-diagnostic for a blockage, and that echo/stress test were normal, per MD notes. Advised that lipid panel should be done end of August, fasting. Explained that LDL goal is less than 70 (was 148). He was notified that no lab reminders are sent out but encouraged to put a reminder on his personal calendar and that he can use any [facility] for the blood test. He verbalized understanding.

## 2023-03-13 ENCOUNTER — Encounter: Payer: Self-pay | Admitting: Oncology

## 2023-03-13 ENCOUNTER — Telehealth: Payer: Self-pay | Admitting: *Deleted

## 2023-03-13 NOTE — Telephone Encounter (Signed)
   Pre-operative Risk Assessment    Patient Name: Joshua Copeland  DOB: 10-13-1955 MRN: [id number]      Request for Surgical Clearance    Procedure:   LEFT TOTAL KNEE ARTHROPLASTY  Date of Surgery:  Clearance 05/21/23                                 Surgeon:  DR. Ollen Gross Surgeon's Group or Practice Name:  [facility] Phone number:  [phone] ATTN: Aida Raider Fax number:  [phone]   Type of Clearance Requested:   - Medical ; ASA    Type of Anesthesia:   CHOICE   Additional requests/questions:    [name]   03/13/2023, 4:14 PM

## 2023-03-14 NOTE — Telephone Encounter (Signed)
   Name: Joshua Copeland  DOB: 12/15/1955  MRN: [id number]   Primary Cardiologist: Little Ishikawa, MD  Chart reviewed as part of pre-operative protocol coverage.   Joshua Copeland was last seen on 02/27/23 by Dr. Bjorn Pippin and was recommended for nuclear stress testing prior to knee surgery.   Nuclear stress test 03/08/23 was nonischemic. No further testing recommended.  Given elevated coronary calcium score, prefer to continue ASA without interruption. However if this would significantly increase bleeding risk, may hold 5-7 days.  Therefore, based on ACC/AHA guidelines, the patient would be at acceptable risk for the planned procedure without further cardiovascular testing.    I will route this recommendation to the requesting party via Epic fax function and remove from pre-op pool. Please call with questions.  Roe Rutherford Neftaly Inzunza, PA 03/14/2023, 5:00 PM

## 2023-03-15 ENCOUNTER — Ambulatory Visit: Payer: Medicare HMO | Admitting: Internal Medicine

## 2023-04-26 DIAGNOSIS — E785 Hyperlipidemia, unspecified: Secondary | ICD-10-CM | POA: Diagnosis not present

## 2023-04-29 ENCOUNTER — Telehealth: Payer: Self-pay | Admitting: Cardiology

## 2023-04-29 NOTE — Telephone Encounter (Signed)
Pt returning call for lab results  

## 2023-04-29 NOTE — Telephone Encounter (Signed)
Will forward to [employer].

## 2023-05-01 ENCOUNTER — Other Ambulatory Visit: Payer: Self-pay

## 2023-05-01 DIAGNOSIS — E785 Hyperlipidemia, unspecified: Secondary | ICD-10-CM

## 2023-05-01 MED ORDER — ROSUVASTATIN CALCIUM 20 MG PO TABS: 20.0000 mg | ORAL_TABLET | Freq: Every day | ORAL | 3 refills | Status: DC

## 2023-05-01 NOTE — Telephone Encounter (Signed)
Called patient left message on personal voice mail to call back for lab results.

## 2023-05-01 NOTE — Telephone Encounter (Signed)
Pt returning nurses call regarding results. Please advise 

## 2023-05-01 NOTE — Telephone Encounter (Signed)
Returned call to pt and it went straight to VM. LVM to return I call. Also informed pt this will be sent to his MyChart as well.

## 2023-05-08 DIAGNOSIS — Z79899 Other long term (current) drug therapy: Secondary | ICD-10-CM | POA: Diagnosis not present

## 2023-05-08 DIAGNOSIS — Z0189 Encounter for other specified special examinations: Secondary | ICD-10-CM | POA: Diagnosis not present

## 2023-05-21 DIAGNOSIS — M1712 Unilateral primary osteoarthritis, left knee: Secondary | ICD-10-CM | POA: Diagnosis not present

## 2023-05-21 DIAGNOSIS — G8918 Other acute postprocedural pain: Secondary | ICD-10-CM | POA: Diagnosis not present

## 2023-05-23 DIAGNOSIS — M25662 Stiffness of left knee, not elsewhere classified: Secondary | ICD-10-CM | POA: Diagnosis not present

## 2023-05-23 DIAGNOSIS — M25562 Pain in left knee: Secondary | ICD-10-CM | POA: Diagnosis not present

## 2023-05-27 DIAGNOSIS — M25562 Pain in left knee: Secondary | ICD-10-CM | POA: Diagnosis not present

## 2023-05-27 DIAGNOSIS — M25662 Stiffness of left knee, not elsewhere classified: Secondary | ICD-10-CM | POA: Diagnosis not present

## 2023-05-29 DIAGNOSIS — M25662 Stiffness of left knee, not elsewhere classified: Secondary | ICD-10-CM | POA: Diagnosis not present

## 2023-05-29 DIAGNOSIS — M25562 Pain in left knee: Secondary | ICD-10-CM | POA: Diagnosis not present

## 2023-05-31 DIAGNOSIS — M25562 Pain in left knee: Secondary | ICD-10-CM | POA: Diagnosis not present

## 2023-05-31 DIAGNOSIS — M25662 Stiffness of left knee, not elsewhere classified: Secondary | ICD-10-CM | POA: Diagnosis not present

## 2023-06-03 DIAGNOSIS — M25562 Pain in left knee: Secondary | ICD-10-CM | POA: Diagnosis not present

## 2023-06-03 DIAGNOSIS — M25662 Stiffness of left knee, not elsewhere classified: Secondary | ICD-10-CM | POA: Diagnosis not present

## 2023-06-05 DIAGNOSIS — M25662 Stiffness of left knee, not elsewhere classified: Secondary | ICD-10-CM | POA: Diagnosis not present

## 2023-06-05 DIAGNOSIS — M25562 Pain in left knee: Secondary | ICD-10-CM | POA: Diagnosis not present

## 2023-06-07 DIAGNOSIS — M25662 Stiffness of left knee, not elsewhere classified: Secondary | ICD-10-CM | POA: Diagnosis not present

## 2023-06-07 DIAGNOSIS — M25562 Pain in left knee: Secondary | ICD-10-CM | POA: Diagnosis not present

## 2023-06-11 DIAGNOSIS — M25562 Pain in left knee: Secondary | ICD-10-CM | POA: Diagnosis not present

## 2023-06-11 DIAGNOSIS — M25662 Stiffness of left knee, not elsewhere classified: Secondary | ICD-10-CM | POA: Diagnosis not present

## 2023-06-18 DIAGNOSIS — M25662 Stiffness of left knee, not elsewhere classified: Secondary | ICD-10-CM | POA: Diagnosis not present

## 2023-06-18 DIAGNOSIS — M25562 Pain in left knee: Secondary | ICD-10-CM | POA: Diagnosis not present

## 2023-06-20 DIAGNOSIS — M25562 Pain in left knee: Secondary | ICD-10-CM | POA: Diagnosis not present

## 2023-06-20 DIAGNOSIS — M25662 Stiffness of left knee, not elsewhere classified: Secondary | ICD-10-CM | POA: Diagnosis not present

## 2023-06-25 DIAGNOSIS — M25662 Stiffness of left knee, not elsewhere classified: Secondary | ICD-10-CM | POA: Diagnosis not present

## 2023-06-25 DIAGNOSIS — M25562 Pain in left knee: Secondary | ICD-10-CM | POA: Diagnosis not present

## 2023-06-26 DIAGNOSIS — Z5189 Encounter for other specified aftercare: Secondary | ICD-10-CM | POA: Diagnosis not present

## 2023-06-28 DIAGNOSIS — E785 Hyperlipidemia, unspecified: Secondary | ICD-10-CM | POA: Diagnosis not present

## 2023-06-29 LAB — LIPID PANEL
Chol/HDL Ratio: 2.6 {ratio} (ref 0.0–5.0)
Cholesterol, Total: 165 mg/dL (ref 100–199)
HDL: 63 mg/dL (ref >39)
LDL Chol Calc (NIH): 89 mg/dL (ref 0–99)
Triglycerides: 70 mg/dL (ref 0–149)
VLDL Cholesterol Cal: 13 mg/dL (ref 5–40)

## 2023-07-02 ENCOUNTER — Ambulatory Visit: Payer: Medicare HMO | Attending: Cardiology | Admitting: Cardiology

## 2023-07-02 ENCOUNTER — Encounter: Payer: Self-pay | Admitting: Cardiology

## 2023-07-02 VITALS — BP 130/76 | HR 88 | Ht 72.0 in | Wt 211.8 lb

## 2023-07-02 DIAGNOSIS — E785 Hyperlipidemia, unspecified: Secondary | ICD-10-CM | POA: Diagnosis not present

## 2023-07-02 DIAGNOSIS — I77819 Aortic ectasia, unspecified site: Secondary | ICD-10-CM

## 2023-07-02 DIAGNOSIS — I10 Essential (primary) hypertension: Secondary | ICD-10-CM | POA: Diagnosis not present

## 2023-07-02 DIAGNOSIS — I2510 Atherosclerotic heart disease of native coronary artery without angina pectoris: Secondary | ICD-10-CM | POA: Diagnosis not present

## 2023-07-02 MED ORDER — EZETIMIBE 10 MG PO TABS: 10.0000 mg | ORAL_TABLET | Freq: Every day | ORAL | 3 refills | Status: DC

## 2023-07-02 NOTE — Patient Instructions (Signed)
Medication Instructions:  Start Zetia 10 mg daily Continue all other medications *If you need a refill on your cardiac medications before your next appointment, please call your pharmacy*   Lab Work: Have a lipid panel done in 2 months    Testing/Procedures: None ordered   Follow-Up: At [facility], you and your health needs are our priority.  As part of our continuing mission to provide you with exceptional heart care, we have created designated Provider Care Teams.  These Care Teams include your primary Cardiologist (physician) and Advanced Practice Providers (APPs -  Physician Assistants and Nurse Practitioners) who all work together to provide you with the care you need, when you need it.  We recommend signing up for the patient portal called "MyChart".  Sign up information is provided on this After Visit Summary.  MyChart is used to connect with patients for Virtual Visits (Telemedicine).  Patients are able to view lab/test results, encounter notes, upcoming appointments, etc.  Non-urgent messages can be sent to your provider as well.   To learn more about what you can do with MyChart, go to [email or web address].    Your next appointment:  1 year   Call in June to schedule Oct appointment     Provider:  Dr.Schumann

## 2023-07-02 NOTE — Progress Notes (Signed)
Cardiology Office Note:    Date:  07/02/2023   ID:  Joshua Copeland, DOB 1956/07/14, MRN [id number]  PCP:  Joshua Hale, MD  Cardiologist:  Little Ishikawa, MD  Electrophysiologist:  None   Referring MD: Joshua Copeland, *   Chief Complaint  Patient presents with   Coronary Artery Disease    History of Present Illness:    Joshua Copeland is a 67 y.o. male with a hx of hyperlipidemia, hypertension who presents for follow-up.  He was referred by Dr. Chanetta Marshall for preop evaluation, initially seen 02/27/2023.  Planning knee replacement.  Denies any chest pain, lightheadedness, syncope, lower extremity edema, or palpitations.  Does report some dyspnea with heavy exertion, occurs about 30 minutes into his workouts.  He typically rides bike 3 times per week for 45 minutes, in addition to going to the gym twice per week.  Can walk up 2-3 flights of stairs without stopping.  No smoking history.  No family history of heart disease.  Calcium score 906 (87th percentile) on 02/12/2023.  Also noted to have mild ascending aortic dilatation measuring 40 mm.  Echocardiogram 03/08/2023 showed normal biventricular function, no significant valvular disease.  Lexiscan Myoview on 03/08/2023 showed normal perfusion, EF 64%.  Since last clinic visit, he reports he is doing well.  States that his knee surgery went well.  Denies any chest pain, dyspnea, lightheadedness, syncope, or palpitations.  Some swelling in left leg since his knee surgery.    No past medical history on file.  No past surgical history on file.  Current Medications: Current Meds  Medication Sig   aspirin EC 81 MG tablet Take 1 tablet (81 mg total) by mouth daily. Swallow whole.   ezetimibe (ZETIA) 10 MG tablet Take 1 tablet (10 mg total) by mouth daily.   latanoprost (XALATAN) 0.005 % ophthalmic solution Place 1 drop into both eyes daily.    omeprazole (PRILOSEC) 20 MG capsule Take 20 mg by mouth every morning.    rosuvastatin (CRESTOR) 20 MG tablet Take 1 tablet (20 mg total) by mouth daily.   tamsulosin (FLOMAX) 0.4 MG CAPS capsule Take 0.4 mg by mouth daily.   timolol (TIMOPTIC) 0.25 % ophthalmic solution Place 1 drop into both eyes daily.   valsartan (DIOVAN) 160 MG tablet Take 160 mg by mouth daily.     Allergies:   Patient has no known allergies.   Social History   Socioeconomic History   Marital status: Married    Spouse name: Not on file   Number of children: Not on file   Years of education: Not on file   Highest education level: Not on file  Occupational History   Not on file  Tobacco Use   Smoking status: Never   Smokeless tobacco: Never  Substance and Sexual Activity   Alcohol use: Not on file   Drug use: Not on file   Sexual activity: Not on file  Other Topics Concern   Not on file  Social History Narrative   Not on file   Social Determinants of Health   Financial Resource Strain: Not on file  Food Insecurity: Not on file  Transportation Needs: Not on file  Physical Activity: Not on file  Stress: Not on file  Social Connections: Not on file     Family History: No family history of heart disease.  ROS:   Please see the history of present illness.     All other systems reviewed and are negative.  EKGs/Labs/Other  Studies Reviewed:    The following studies were reviewed today:   EKG:  02/27/2023: Normal sinus rhythm, rate 76, Q waves in leads III, aVF 07/02/23: Normal sinus rhythm, rate 88, no ST abnormalities  Recent Labs: 07/30/2022: Hemoglobin 15.7; Platelet Count 190  Recent Lipid Panel    Component Value Date/Time   CHOL 165 06/28/2023 0921   TRIG 70 06/28/2023 0921   HDL 63 06/28/2023 0921   CHOLHDL 2.6 06/28/2023 0921   LDLCALC 89 06/28/2023 0921    Physical Exam:    VS:  BP 130/76 (BP Location: Right Arm, Patient Position: Sitting, Cuff Size: Normal)   Pulse 88   Ht 6' (1.829 m)   Wt 211 lb 12.8 oz (96.1 kg)   SpO2 95%   BMI 28.73 kg/m      Wt Readings from Last 3 Encounters:  07/02/23 211 lb 12.8 oz (96.1 kg)  03/08/23 212 lb (96.2 kg)  02/27/23 212 lb 12.8 oz (96.5 kg)     GEN:  Well nourished, well developed in no acute distress HEENT: Normal NECK: No JVD; No carotid bruits LYMPHATICS: No lymphadenopathy CARDIAC: RRR, no murmurs, rubs, gallops RESPIRATORY:  Clear to auscultation without rales, wheezing or rhonchi  ABDOMEN: Soft, non-tender, non-distended MUSCULOSKELETAL:  No edema; No deformity  SKIN: Warm and dry NEUROLOGIC:  Alert and oriented x 3 PSYCHIATRIC:  Normal affect   ASSESSMENT:    1. Coronary artery disease involving native coronary artery of native heart, unspecified whether angina present   2. Essential hypertension   3. Hyperlipidemia, unspecified hyperlipidemia type   4. Aortic dilatation (HCC)     PLAN:    CAD: Calcium score 906 (87th percentile) on 02/12/2023.  Reports dyspnea with significant exertion.  Echocardiogram 03/08/2023 showed normal biventricular function, no significant valvular disease.  Lexiscan Myoview on 03/08/2023 showed normal perfusion, EF 64%. -Continue aspirin 81 mg daily -Continue rosuvastatin 10 mg daily  Aortic dilatation: Mild ascending aortic dilatation measuring 40 mm.  Will monitor  Hypertension: On valsartan 160 mg daily.  Appears controlled  Hyperlipidemia: LDL 89 on 06/28/2023, on rosuvastatin 20 mg daily.  Recommend adding Zetia 10 mg daily  AKI: Cr 1.7 in 01/2020, most recent labs 01/2023 showed kidney function was normal, creatinine 1.25  RTC in 1 year   Medication Adjustments/Labs and Tests Ordered: Current medicines are reviewed at length with the patient today.  Concerns regarding medicines are outlined above.  Orders Placed This Encounter  Procedures   Lipid panel   EKG 12-Lead   Meds ordered this encounter  Medications   ezetimibe (ZETIA) 10 MG tablet    Sig: Take 1 tablet (10 mg total) by mouth daily.    Dispense:  90 tablet    Refill:  3     Patient Instructions  Medication Instructions:  Start Zetia 10 mg daily Continue all other medications *If you need a refill on your cardiac medications before your next appointment, please call your pharmacy*   Lab Work: Have a lipid panel done in 2 months    Testing/Procedures: None ordered   Follow-Up: At [facility], you and your health needs are our priority.  As part of our continuing mission to provide you with exceptional heart care, we have created designated Provider Care Teams.  These Care Teams include your primary Cardiologist (physician) and Advanced Practice Providers (APPs -  Physician Assistants and Nurse Practitioners) who all work together to provide you with the care you need, when you need it.  We  recommend signing up for the patient portal called "MyChart".  Sign up information is provided on this After Visit Summary.  MyChart is used to connect with patients for Virtual Visits (Telemedicine).  Patients are able to view lab/test results, encounter notes, upcoming appointments, etc.  Non-urgent messages can be sent to your provider as well.   To learn more about what you can do with MyChart, go to [email or web address].    Your next appointment:  1 year   Call in June to schedule Oct appointment     Provider:  Dr.Onie Kasparek     Signed, Little Ishikawa, MD  07/02/2023 9:47 AM    [facility]

## 2023-07-04 DIAGNOSIS — I10 Essential (primary) hypertension: Secondary | ICD-10-CM | POA: Diagnosis not present

## 2023-07-04 DIAGNOSIS — C9110 Chronic lymphocytic leukemia of B-cell type not having achieved remission: Secondary | ICD-10-CM | POA: Diagnosis not present

## 2023-07-04 DIAGNOSIS — Z23 Encounter for immunization: Secondary | ICD-10-CM | POA: Diagnosis not present

## 2023-07-04 DIAGNOSIS — I2510 Atherosclerotic heart disease of native coronary artery without angina pectoris: Secondary | ICD-10-CM | POA: Diagnosis not present

## 2023-07-04 DIAGNOSIS — I700 Atherosclerosis of aorta: Secondary | ICD-10-CM | POA: Diagnosis not present

## 2023-07-04 DIAGNOSIS — R739 Hyperglycemia, unspecified: Secondary | ICD-10-CM | POA: Diagnosis not present

## 2023-07-17 DIAGNOSIS — U071 COVID-19: Secondary | ICD-10-CM | POA: Diagnosis not present

## 2023-07-31 ENCOUNTER — Inpatient Hospital Stay: Payer: Medicare HMO | Attending: Oncology

## 2023-07-31 ENCOUNTER — Inpatient Hospital Stay: Payer: Medicare HMO | Admitting: Oncology

## 2023-07-31 VITALS — BP 118/82 | HR 63 | Temp 98.2°F | Resp 18 | Ht 72.0 in | Wt 212.9 lb

## 2023-07-31 DIAGNOSIS — E785 Hyperlipidemia, unspecified: Secondary | ICD-10-CM | POA: Insufficient documentation

## 2023-07-31 DIAGNOSIS — D72829 Elevated white blood cell count, unspecified: Secondary | ICD-10-CM | POA: Diagnosis not present

## 2023-07-31 DIAGNOSIS — I10 Essential (primary) hypertension: Secondary | ICD-10-CM | POA: Diagnosis not present

## 2023-07-31 DIAGNOSIS — M1990 Unspecified osteoarthritis, unspecified site: Secondary | ICD-10-CM | POA: Diagnosis not present

## 2023-07-31 DIAGNOSIS — C9110 Chronic lymphocytic leukemia of B-cell type not having achieved remission: Secondary | ICD-10-CM | POA: Insufficient documentation

## 2023-07-31 DIAGNOSIS — H409 Unspecified glaucoma: Secondary | ICD-10-CM | POA: Insufficient documentation

## 2023-07-31 DIAGNOSIS — K219 Gastro-esophageal reflux disease without esophagitis: Secondary | ICD-10-CM | POA: Insufficient documentation

## 2023-07-31 LAB — CBC WITH DIFFERENTIAL (CANCER CENTER ONLY)
Abs Immature Granulocytes: 0.03 10*3/uL (ref 0.00–0.07)
Basophils Absolute: 0 10*3/uL (ref 0.0–0.1)
Basophils Relative: 0 %
Eosinophils Absolute: 0.2 10*3/uL (ref 0.0–0.5)
Eosinophils Relative: 2 %
HCT: 45.7 % (ref 39.0–52.0)
Hemoglobin: 14.8 g/dL (ref 13.0–17.0)
Immature Granulocytes: 0 %
Lymphocytes Relative: 57 %
Lymphs Abs: 7 10*3/uL — ABNORMAL HIGH (ref 0.7–4.0)
MCH: 30 pg (ref 26.0–34.0)
MCHC: 32.4 g/dL (ref 30.0–36.0)
MCV: 92.7 fL (ref 80.0–100.0)
Monocytes Absolute: 0.8 10*3/uL (ref 0.1–1.0)
Monocytes Relative: 7 %
Neutro Abs: 4.1 10*3/uL (ref 1.7–7.7)
Neutrophils Relative %: 34 %
Platelet Count: 184 10*3/uL (ref 150–400)
RBC: 4.93 MIL/uL (ref 4.22–5.81)
RDW: 13.6 % (ref 11.5–15.5)
WBC Count: 12.2 10*3/uL — ABNORMAL HIGH (ref 4.0–10.5)
nRBC: 0 % (ref 0.0–0.2)

## 2023-07-31 NOTE — Progress Notes (Signed)
  [facility] OFFICE PROGRESS NOTE   Diagnosis: CLL  INTERVAL HISTORY:   Mr. Bemiss returns as scheduled.  Good appetite.  No fever or night sweats.  No palpable lymph nodes.  He underwent left knee replacement surgery in September.  The knee remains swollen.  He is now ambulating up to 2 miles.  He had COVID-19 3 weeks ago.  He reports his symptoms quickly resolved after taking Paxlovid.  Objective:  Vital signs in last 24 hours:  Blood pressure 118/82, pulse 63, temperature 98.2 F (36.8 C), temperature source Temporal, resp. rate 18, height 6' (1.829 m), weight 212 lb 14.4 oz (96.6 kg), SpO2 100%.   Lymphatics: No cervical, supraclavicular, axillary, or inguinal nodes Resp: Lungs clear bilaterally Cardio: Regular rate and rhythm GI: No hepatosplenomegaly Vascular: Trace edema to left lower leg with edema surrounding the left knee     Lab Results:  Lab Results  Component Value Date   WBC 12.2 (H) 07/31/2023   HGB 14.8 07/31/2023   HCT 45.7 07/31/2023   MCV 92.7 07/31/2023   PLT 184 07/31/2023   NEUTROABS 4.1 07/31/2023    CMP  Lab Results  Component Value Date   NA 141 02/01/2020   K 4.3 02/01/2020   CL 105 02/01/2020   CO2 23 02/01/2020   GLUCOSE 139 (H) 02/01/2020   BUN 24 (H) 02/01/2020   CREATININE 1.74 (H) 02/01/2020   CALCIUM 9.2 02/01/2020   PROT 7.1 02/01/2020   ALBUMIN 4.6 02/01/2020   AST 18 02/01/2020   ALT 23 02/01/2020   ALKPHOS 68 02/01/2020   BILITOT 0.8 02/01/2020   GFRNONAA 41 (L) 02/01/2020   GFRAA 47 (L) 02/01/2020     Medications: I have reviewed the patient's current medications.   Assessment/Plan: Lymphocytosis-CLL Peripheral blood flow cytometry 10/23/2021-monoclonal B-cell population, CD5, CD19, CD20, CD38, CD200, and lambda positive  Hypertension Glaucoma Gastroesophageal reflux Hyperlipidemia Arthritis     Disposition: Mr. Dort has early stage CLL.  He is asymptomatic.  There is no indication for  treating the CLL at present.  He will remain up-to-date on influenza and pneumonia vaccines.  He will return for an office visit in 1 year.  He will call for new symptoms in the interim.  Thornton Papas, MD  07/31/2023  10:05 AM

## 2023-09-05 DIAGNOSIS — E785 Hyperlipidemia, unspecified: Secondary | ICD-10-CM | POA: Diagnosis not present

## 2023-09-05 DIAGNOSIS — I10 Essential (primary) hypertension: Secondary | ICD-10-CM | POA: Diagnosis not present

## 2023-09-06 ENCOUNTER — Telehealth: Payer: Self-pay | Admitting: *Deleted

## 2023-09-06 DIAGNOSIS — E785 Hyperlipidemia, unspecified: Secondary | ICD-10-CM

## 2023-09-06 LAB — LIPID PANEL
Chol/HDL Ratio: 2.7 {ratio} (ref 0.0–5.0)
Cholesterol, Total: 162 mg/dL (ref 100–199)
HDL: 59 mg/dL (ref >39)
LDL Chol Calc (NIH): 84 mg/dL (ref 0–99)
Triglycerides: 107 mg/dL (ref 0–149)
VLDL Cholesterol Cal: 19 mg/dL (ref 5–40)

## 2023-09-06 MED ORDER — EZETIMIBE 10 MG PO TABS
10.0000 mg | ORAL_TABLET | Freq: Every day | ORAL | 3 refills | Status: DC
Start: 2023-09-06 — End: 2023-12-05

## 2023-09-06 NOTE — Telephone Encounter (Signed)
 Called and made patient aware of lab result and  per Dr. Bjorn Pippin to start Zetia 10 mg sent to patient's Pharmacy of choice. Made patient aware to have fasting lab work , Lipid Panel in 3 months. Patient voiced an understanding.

## 2023-09-09 ENCOUNTER — Encounter: Payer: Self-pay | Admitting: *Deleted

## 2023-09-09 DIAGNOSIS — E785 Hyperlipidemia, unspecified: Secondary | ICD-10-CM

## 2023-09-10 ENCOUNTER — Other Ambulatory Visit: Payer: Self-pay | Admitting: *Deleted

## 2023-09-10 DIAGNOSIS — E785 Hyperlipidemia, unspecified: Secondary | ICD-10-CM

## 2023-09-10 MED ORDER — EZETIMIBE 10 MG PO TABS: 10.0000 mg | ORAL_TABLET | Freq: Every day | ORAL | 3 refills | Status: AC

## 2023-09-10 NOTE — Telephone Encounter (Signed)
 Called patient and made aware of Dr. Bjorn Pippin recommendations, Zetia 10 mg sent to patient Pharmacy and lab work ordered. Patient verbalized an understanding.

## 2023-09-12 ENCOUNTER — Other Ambulatory Visit: Payer: Self-pay | Admitting: *Deleted

## 2023-09-12 DIAGNOSIS — E785 Hyperlipidemia, unspecified: Secondary | ICD-10-CM

## 2023-09-12 NOTE — Addendum Note (Signed)
 Addended by: Lizabeth Leyden R on: 09/12/2023 10:55 AM   Modules accepted: Orders

## 2023-09-27 DIAGNOSIS — Z96652 Presence of left artificial knee joint: Secondary | ICD-10-CM | POA: Diagnosis not present

## 2023-11-15 DIAGNOSIS — D485 Neoplasm of uncertain behavior of skin: Secondary | ICD-10-CM | POA: Diagnosis not present

## 2023-11-15 DIAGNOSIS — L570 Actinic keratosis: Secondary | ICD-10-CM | POA: Diagnosis not present

## 2023-11-15 DIAGNOSIS — L111 Transient acantholytic dermatosis [Grover]: Secondary | ICD-10-CM | POA: Diagnosis not present

## 2023-11-15 DIAGNOSIS — L820 Inflamed seborrheic keratosis: Secondary | ICD-10-CM | POA: Diagnosis not present

## 2023-11-15 DIAGNOSIS — D2272 Melanocytic nevi of left lower limb, including hip: Secondary | ICD-10-CM | POA: Diagnosis not present

## 2023-11-15 DIAGNOSIS — D1801 Hemangioma of skin and subcutaneous tissue: Secondary | ICD-10-CM | POA: Diagnosis not present

## 2023-11-15 DIAGNOSIS — L814 Other melanin hyperpigmentation: Secondary | ICD-10-CM | POA: Diagnosis not present

## 2023-11-15 DIAGNOSIS — D225 Melanocytic nevi of trunk: Secondary | ICD-10-CM | POA: Diagnosis not present

## 2023-12-04 DIAGNOSIS — M25552 Pain in left hip: Secondary | ICD-10-CM | POA: Diagnosis not present

## 2023-12-11 DIAGNOSIS — M25552 Pain in left hip: Secondary | ICD-10-CM | POA: Diagnosis not present

## 2023-12-16 DIAGNOSIS — E785 Hyperlipidemia, unspecified: Secondary | ICD-10-CM | POA: Diagnosis not present

## 2023-12-17 DIAGNOSIS — Z23 Encounter for immunization: Secondary | ICD-10-CM | POA: Diagnosis not present

## 2023-12-17 LAB — LIPID PANEL
Chol/HDL Ratio: 2.4 ratio (ref 0.0–5.0)
Cholesterol, Total: 146 mg/dL (ref 100–199)
HDL: 60 mg/dL (ref >39)
LDL Chol Calc (NIH): 69 mg/dL (ref 0–99)
Triglycerides: 93 mg/dL (ref 0–149)
VLDL Cholesterol Cal: 17 mg/dL (ref 5–40)

## 2023-12-19 ENCOUNTER — Encounter: Payer: Self-pay | Admitting: *Deleted

## 2023-12-26 DIAGNOSIS — M25462 Effusion, left knee: Secondary | ICD-10-CM | POA: Diagnosis not present

## 2023-12-26 DIAGNOSIS — M1612 Unilateral primary osteoarthritis, left hip: Secondary | ICD-10-CM | POA: Diagnosis not present

## 2023-12-26 DIAGNOSIS — Z96652 Presence of left artificial knee joint: Secondary | ICD-10-CM | POA: Diagnosis not present

## 2023-12-27 ENCOUNTER — Telehealth: Payer: Self-pay

## 2023-12-27 NOTE — Telephone Encounter (Signed)
   Pre-operative Risk Assessment    Patient Name: Joshua Copeland  DOB: 1955/09/09 MRN: [id number]   Date of last office visit: 07/02/23 Date of next office visit: Not scheduled   Request for Surgical Clearance    Procedure:   Left total Hip Arthroplasty  Date of Surgery:  Clearance 04/21/24                                Surgeon:  Dr. Liliane Rei Surgeon's Group or Practice Name:  [facility] Phone number:  [phone] Fax number:  [phone]   Type of Clearance Requested:   - Medical    Type of Anesthesia:   Choice   Additional requests/questions:    Gardiner Jumper   12/27/2023, 5:06 PM

## 2023-12-30 ENCOUNTER — Telehealth: Payer: Self-pay

## 2023-12-30 NOTE — Telephone Encounter (Signed)
   Name: Joshua Copeland  DOB: 11-08-1955  MRN: [id number]  Primary Cardiologist: Wendie Hamburg, MD   Preoperative team, please contact this patient and set up a phone call appointment for further preoperative risk assessment. Please obtain consent and complete medication review. Thank you for your help.  I confirm that guidance regarding antiplatelet and oral anticoagulation therapy has been completed and, if necessary, noted below.  None requested.  I also confirmed the patient resides in the state of [place] . As per [facility] Medical Board telemedicine laws, the patient must reside in the state in which the provider is licensed.   Carie Charity, NP 12/30/2023, 8:16 AM [facility]

## 2023-12-30 NOTE — Telephone Encounter (Signed)
 Spoke with patient who is agreeable to do a tele visit on 7/31 at 9:00am. Consent done, med rec needs to be completed.

## 2023-12-30 NOTE — Telephone Encounter (Signed)
  Patient Consent for Virtual Visit        Joshua Copeland has provided verbal consent on 12/30/2023 for a virtual visit (video or telephone).   CONSENT FOR VIRTUAL VISIT FOR:  Joshua Copeland  By participating in this virtual visit I agree to the following:  I hereby voluntarily request, consent and authorize [facility] and its employed or contracted physicians, physician assistants, nurse practitioners or other licensed health care professionals (the Practitioner), to provide me with telemedicine health care services (the "Services") as deemed necessary by the treating Practitioner. I acknowledge and consent to receive the Services by the Practitioner via telemedicine. I understand that the telemedicine visit will involve communicating with the Practitioner through live audiovisual communication technology and the disclosure of certain medical information by electronic transmission. I acknowledge that I have been given the opportunity to request an in-person assessment or other available alternative prior to the telemedicine visit and am voluntarily participating in the telemedicine visit.  I understand that I have the right to withhold or withdraw my consent to the use of telemedicine in the course of my care at any time, without affecting my right to future care or treatment, and that the Practitioner or I may terminate the telemedicine visit at any time. I understand that I have the right to inspect all information obtained and/or recorded in the course of the telemedicine visit and may receive copies of available information for a reasonable fee.  I understand that some of the potential risks of receiving the Services via telemedicine include:  Delay or interruption in medical evaluation due to technological equipment failure or disruption; Information transmitted may not be sufficient (e.g. poor resolution of images) to allow for appropriate medical decision making by the Practitioner;  and/or  In rare instances, security protocols could fail, causing a breach of personal health information.  Furthermore, I acknowledge that it is my responsibility to provide information about my medical history, conditions and care that is complete and accurate to the best of my ability. I acknowledge that Practitioner's advice, recommendations, and/or decision may be based on factors not within their control, such as incomplete or inaccurate data provided by me or distortions of diagnostic images or specimens that may result from electronic transmissions. I understand that the practice of medicine is not an exact science and that Practitioner makes no warranties or guarantees regarding treatment outcomes. I acknowledge that a copy of this consent can be made available to me via my patient portal [facility] MyChart), or I can request a printed copy by calling the office of [facility].    I understand that my insurance will be billed for this visit.   I have read or had this consent read to me. I understand the contents of this consent, which adequately explains the benefits and risks of the Services being provided via telemedicine.  I have been provided ample opportunity to ask questions regarding this consent and the Services and have had my questions answered to my satisfaction. I give my informed consent for the services to be provided through the use of telemedicine in my medical care

## 2024-02-17 DIAGNOSIS — H401131 Primary open-angle glaucoma, bilateral, mild stage: Secondary | ICD-10-CM | POA: Diagnosis not present

## 2024-02-24 DIAGNOSIS — Z1211 Encounter for screening for malignant neoplasm of colon: Secondary | ICD-10-CM | POA: Diagnosis not present

## 2024-03-26 NOTE — Telephone Encounter (Signed)
 2nd request received. Will route update to the surgeons office.

## 2024-03-28 ENCOUNTER — Other Ambulatory Visit: Payer: Self-pay | Admitting: Cardiology

## 2024-04-01 NOTE — Progress Notes (Unsigned)
 Virtual Visit via Telephone Note   Because of Joshua Copeland co-morbid illnesses, he is at least at moderate risk for complications without adequate follow up.  This format is felt to be most appropriate for this patient at this time.  Due to technical limitations with video connection (technology), today's appointment will be conducted as an audio only telehealth visit, and Joshua Copeland verbally agreed to proceed in this manner.   All issues noted in this document were discussed and addressed.  No physical exam could be performed with this format.  Evaluation Performed:  Preoperative cardiovascular risk assessment _____________   Date:  04/01/2024   Patient ID:  Joshua Copeland, DOB October 04, 1955, MRN [id number] Patient Location:  Home Provider location:   Office  Primary Care Provider:  Chrystal Lamarr RAMAN, MD Primary Cardiologist:  Lonni LITTIE Nanas, MD  Chief Complaint / Patient Profile   68 y.o. y/o male with a h/o coronary artery disease, essential hypertension, hyperlipidemia, aortic dilation who is pending left total hip arthroplasty and presents today for telephonic preoperative cardiovascular risk assessment.  History of Present Illness    Joshua Copeland is a 68 y.o. male who presents via audio/video conferencing for a telehealth visit today.  Pt was last seen in cardiology clinic on 07/02/2023 by Dr. Nanas.  At that time Joshua Copeland was doing well .  The patient is now pending procedure as outlined above. Since his last visit, he continues to be stable from a cardiac standpoint.  Today he denies chest pain, shortness of breath, lower extremity edema, fatigue, palpitations, melena, hematuria, hemoptysis, diaphoresis, weakness, presyncope, syncope, orthopnea, and PND.    Past Medical History    No past medical history on file. No past surgical history on file.  Allergies  No Known Allergies  Home Medications    Prior to Admission medications   Medication Sig Start  Date End Date Taking? Authorizing Provider  aspirin  EC 81 MG tablet TAKE 1 TABLET (81 MG TOTAL) BY MOUTH DAILY. SWALLOW WHOLE. 03/30/24   Nanas Lonni LITTIE, MD  ezetimibe  (ZETIA ) 10 MG tablet Take 1 tablet (10 mg total) by mouth daily. 09/10/23 12/09/23  Nanas Lonni LITTIE, MD  latanoprost (XALATAN) 0.005 % ophthalmic solution Place 1 drop into both eyes daily.  01/11/20   [provider]  omeprazole (PRILOSEC) 20 MG capsule Take 20 mg by mouth every morning. 10/06/21   [provider]  rosuvastatin  (CRESTOR ) 20 MG tablet Take 1 tablet (20 mg total) by mouth daily. 05/01/23 07/31/23  Nanas Lonni LITTIE, MD  tamsulosin (FLOMAX) 0.4 MG CAPS capsule Take 0.4 mg by mouth daily.    [provider]  timolol (TIMOPTIC) 0.25 % ophthalmic solution Place 1 drop into both eyes daily. 01/12/20   [provider]  valsartan (DIOVAN) 160 MG tablet Take 160 mg by mouth daily. 06/03/22   [provider]    Physical Exam    Vital Signs:  Neilan Rizzo does not have vital signs available for review today.  Given telephonic nature of communication, physical exam is limited. AAOx3. NAD. Normal affect.  Speech and respirations are unlabored.  Accessory Clinical Findings    None  Assessment & Plan    1.  Preoperative Cardiovascular Risk Assessment: Left total Hip Arthroplasty   Date of Surgery:  Clearance 04/21/24  Surgeon:  Dr. Dempsey Moan Surgeon's Group or Practice Name:  [facility] Phone number:  [phone] Fax number:  [phone]     Primary Cardiologist: Lonni LITTIE Nanas, MD  Chart reviewed as part of pre-operative protocol coverage. Given past medical history and time since last visit, based on ACC/AHA guidelines, Joshua Copeland would be at acceptable risk for the planned procedure without further cardiovascular testing.   His RCRI is low risk, 0.9% risk of major cardiac event.  He is able to complete  greater than 4 METS of physical activity.  Patient was advised that if he develops new symptoms prior to surgery to contact our office to arrange a follow-up appointment.  He verbalized understanding.  I will route this recommendation to the requesting party via Epic fax function and remove from pre-op pool.       Time:   Today, I have spent 5 minutes with the patient with telehealth technology discussing medical history, symptoms, and management plan.  I spent 10 minutes reviewing patient's past cardiac history and cardiac medications.    [name], NP  04/01/2024, 1:11 PM

## 2024-04-02 ENCOUNTER — Ambulatory Visit: Attending: Cardiology

## 2024-04-02 DIAGNOSIS — Z01810 Encounter for preprocedural cardiovascular examination: Secondary | ICD-10-CM

## 2024-04-17 ENCOUNTER — Other Ambulatory Visit: Payer: Self-pay | Admitting: Cardiology

## 2024-04-20 ENCOUNTER — Other Ambulatory Visit: Payer: Self-pay

## 2024-04-20 ENCOUNTER — Other Ambulatory Visit (HOSPITAL_COMMUNITY): Payer: Self-pay

## 2024-04-20 MED ORDER — HYDROMORPHONE HCL 2 MG PO TABS
2.0000 mg | ORAL_TABLET | Freq: Four times a day (QID) | ORAL | 0 refills | Status: DC | PRN
  Filled 2024-04-20: qty 42, 6d supply, fill #0

## 2024-04-21 ENCOUNTER — Ambulatory Visit: Admit: 2024-04-21 | Admitting: Orthopedic Surgery

## 2024-04-21 DIAGNOSIS — M1612 Unilateral primary osteoarthritis, left hip: Secondary | ICD-10-CM | POA: Diagnosis not present

## 2024-04-21 DIAGNOSIS — M25752 Osteophyte, left hip: Secondary | ICD-10-CM | POA: Diagnosis not present

## 2024-04-21 SURGERY — ARTHROPLASTY, HIP, TOTAL, ANTERIOR APPROACH
Anesthesia: Choice | Site: Hip | Laterality: Left

## 2024-04-27 DIAGNOSIS — M25462 Effusion, left knee: Secondary | ICD-10-CM | POA: Diagnosis not present

## 2024-04-27 DIAGNOSIS — Z96652 Presence of left artificial knee joint: Secondary | ICD-10-CM | POA: Diagnosis not present

## 2024-04-27 DIAGNOSIS — Z96642 Presence of left artificial hip joint: Secondary | ICD-10-CM | POA: Diagnosis not present

## 2024-05-19 DIAGNOSIS — Z23 Encounter for immunization: Secondary | ICD-10-CM | POA: Diagnosis not present

## 2024-05-26 DIAGNOSIS — R972 Elevated prostate specific antigen [PSA]: Secondary | ICD-10-CM | POA: Diagnosis not present

## 2024-06-02 DIAGNOSIS — Z5189 Encounter for other specified aftercare: Secondary | ICD-10-CM | POA: Diagnosis not present

## 2024-06-04 DIAGNOSIS — Z23 Encounter for immunization: Secondary | ICD-10-CM | POA: Diagnosis not present

## 2024-06-24 NOTE — Progress Notes (Unsigned)
 Cardiology Office Note:    Date:  06/26/2024   ID:  Joshua Copeland, DOB [birth year], MRN [id number]  PCP:  Chrystal Lamarr RAMAN, MD  Cardiologist:  Lonni LITTIE Nanas, MD  Electrophysiologist:  None   Referring MD: Chrystal Lamarr RAMAN, *   Chief Complaint  Patient presents with   Coronary Artery Disease    History of Present Illness:    Joshua Copeland is a 68 y.o. male with a hx of hyperlipidemia, hypertension who presents for follow-up.  He was referred by Dr. Chrystal for preop evaluation, initially seen 02/27/2023.    Calcium  score 906 (87th percentile) on 02/12/2023.  Also noted to have mild ascending aortic dilatation measuring 40 mm.  Echocardiogram 03/08/2023 showed normal biventricular function, no significant valvular disease.  Lexiscan  Myoview  on 03/08/2023 showed normal perfusion, EF 64%.  Since last clinic visit, he reports he is doing well.  Denies any chest pain, dyspnea, lightheadedness, syncope, or palpitations.  Does report some swelling in left leg, had hip surgery 8 weeks ago.  He is walking 4 miles 2 days/week and will go to the gym a few days per week and then rides his bike other days.  Denies exertional symptoms.   No past medical history on file.  No past surgical history on file.  Current Medications: Current Meds  Medication Sig   aspirin  EC 81 MG tablet TAKE 1 TABLET (81 MG TOTAL) BY MOUTH DAILY. SWALLOW WHOLE.   ezetimibe  (ZETIA ) 10 MG tablet Take 1 tablet (10 mg total) by mouth daily.   latanoprost (XALATAN) 0.005 % ophthalmic solution Place 1 drop into both eyes daily.    omeprazole (PRILOSEC) 20 MG capsule Take 20 mg by mouth every morning.   rosuvastatin  (CRESTOR ) 20 MG tablet TAKE 1 TABLET BY MOUTH EVERY DAY   tamsulosin (FLOMAX) 0.4 MG CAPS capsule Take 0.4 mg by mouth daily.   timolol (TIMOPTIC) 0.25 % ophthalmic solution Place 1 drop into both eyes daily.   valsartan (DIOVAN) 160 MG tablet Take 160 mg by mouth daily.     Allergies:   Patient  has no known allergies.   Social History   Socioeconomic History   Marital status: Married    Spouse name: Not on file   Number of children: Not on file   Years of education: Not on file   Highest education level: Not on file  Occupational History   Not on file  Tobacco Use   Smoking status: Never   Smokeless tobacco: Never  Substance and Sexual Activity   Alcohol use: Not on file   Drug use: Not on file   Sexual activity: Not on file  Other Topics Concern   Not on file  Social History Narrative   Not on file   Social Drivers of Health   Financial Resource Strain: Not on file  Food Insecurity: Not on file  Transportation Needs: Not on file  Physical Activity: Not on file  Stress: Not on file  Social Connections: Not on file     Family History: No family history of heart disease.  ROS:   Please see the history of present illness.     All other systems reviewed and are negative.  EKGs/Labs/Other Studies Reviewed:    The following studies were reviewed today:   EKG:  02/27/2023: Normal sinus rhythm, rate 76, Q waves in leads III, aVF 07/02/23: Normal sinus rhythm, rate 88, no ST abnormalities 06/25/2024: Normal sinus rhythm, rate 65, no ST abnormalities  Recent Labs: 07/31/2023:  Hemoglobin 14.8; Platelet Count 184 06/25/2024: BUN 15; Creatinine, Ser 1.19; Potassium 4.9; Sodium 140  Recent Lipid Panel    Component Value Date/Time   CHOL 146 12/16/2023 0957   TRIG 93 12/16/2023 0957   HDL 60 12/16/2023 0957   CHOLHDL 2.4 12/16/2023 0957   LDLCALC 69 12/16/2023 0957    Physical Exam:    VS:  BP 120/80 (BP Location: Left Arm, Patient Position: Sitting, Cuff Size: Large)   Pulse 66   Ht 6' (1.829 m)   Wt 224 lb 9.6 oz (101.9 kg)   SpO2 94%   BMI 30.46 kg/m     Wt Readings from Last 3 Encounters:  06/25/24 224 lb 9.6 oz (101.9 kg)  07/31/23 212 lb 14.4 oz (96.6 kg)  07/02/23 211 lb 12.8 oz (96.1 kg)     GEN:  Well nourished, well developed in no  acute distress HEENT: Normal NECK: No JVD; No carotid bruits LYMPHATICS: No lymphadenopathy CARDIAC: RRR, no murmurs, rubs, gallops RESPIRATORY:  Clear to auscultation without rales, wheezing or rhonchi  ABDOMEN: Soft, non-tender, non-distended MUSCULOSKELETAL:  No edema; No deformity  SKIN: Warm and dry NEUROLOGIC:  Alert and oriented x 3 PSYCHIATRIC:  Normal affect   ASSESSMENT:    1. Coronary artery disease involving native coronary artery of native heart, unspecified whether angina present   2. Aortic dilatation   3. Essential hypertension   4. Hyperlipidemia, unspecified hyperlipidemia type   5. AKI (acute kidney injury)      PLAN:    CAD: Calcium  score 906 (87th percentile) on 02/12/2023.  Reports dyspnea with significant exertion.  Echocardiogram 03/08/2023 showed normal biventricular function, no significant valvular disease.  Lexiscan  Myoview  on 03/08/2023 showed normal perfusion, EF 64%. -Continue aspirin  81 mg daily -Continue rosuvastatin  10 mg daily and Zetia  10 mg daily  Aortic dilatation: Mild ascending aortic dilatation measuring 40 mm on calcium  score 02/2023.  Recommend echocardiogram to monitor  Hypertension: On valsartan 160 mg daily.  Appears controlled  Hyperlipidemia: LDL 89 on 06/28/2023, on rosuvastatin  20 mg daily.  Added Zetia  10 mg daily.  LDL 69 on 12/2023  AKI: Cr 1.7 in 01/2020,  labs 01/2023 showed kidney function was normal, creatinine 1.25 -Will check BMET  RTC in 1 year   Medication Adjustments/Labs and Tests Ordered: Current medicines are reviewed at length with the patient today.  Concerns regarding medicines are outlined above.  Orders Placed This Encounter  Procedures   Basic Metabolic Panel (BMET)   EKG 12-Lead   ECHOCARDIOGRAM COMPLETE   No orders of the defined types were placed in this encounter.   Patient Instructions  Medication Instructions:  Your physician recommends that you continue on your current medications as directed.  Please refer to the Current Medication list given to you today.  *If you need a refill on your cardiac medications before your next appointment, please call your pharmacy*  Lab Work: none If you have labs (blood work) drawn today and your tests are completely normal, you will receive your results only by: MyChart Message (if you have MyChart) OR A paper copy in the mail If you have any lab test that is abnormal or we need to change your treatment, we will call you to review the results.  Testing/Procedures: Echo  Your physician has requested that you have an echocardiogram. Echocardiography is a painless test that uses sound waves to create images of your heart. It provides your doctor with information about the size and shape of your heart and  how well your heart's chambers and valves are working. This procedure takes approximately one hour. There are no restrictions for this procedure. Please do NOT wear cologne, perfume, aftershave, or lotions (deodorant is allowed). Please arrive 15 minutes prior to your appointment time.  Please note: We ask at that you not bring children with you during ultrasound (echo/ vascular) testing. Due to room size and safety concerns, children are not allowed in the ultrasound rooms during exams. Our front office staff cannot provide observation of children in our lobby area while testing is being conducted. An adult accompanying a patient to their appointment will only be allowed in the ultrasound room at the discretion of the ultrasound technician under special circumstances. We apologize for any inconvenience.   Follow-Up: At [facility], you and your health needs are our priority.  As part of our continuing mission to provide you with exceptional heart care, our providers are all part of one team.  This team includes your primary Cardiologist (physician) and Advanced Practice Providers or APPs (Physician Assistants and Nurse Practitioners) who all  work together to provide you with the care you need, when you need it.  Your next appointment:   1 year  Provider:   Dr. Kate  We recommend signing up for the patient portal called MyChart.  Sign up information is provided on this After Visit Summary.  MyChart is used to connect with patients for Virtual Visits (Telemedicine).  Patients are able to view lab/test results, encounter notes, upcoming appointments, etc.  Non-urgent messages can be sent to your provider as well.   To learn more about what you can do with MyChart, go to [email or web address].   Other Instructions none           Signed, Lonni LITTIE Kate, MD  06/26/2024 5:53 PM    [facility]

## 2024-06-25 ENCOUNTER — Encounter: Payer: Self-pay | Admitting: Cardiology

## 2024-06-25 ENCOUNTER — Ambulatory Visit: Attending: Internal Medicine | Admitting: Cardiology

## 2024-06-25 VITALS — BP 120/80 | HR 66 | Ht 72.0 in | Wt 224.6 lb

## 2024-06-25 DIAGNOSIS — E785 Hyperlipidemia, unspecified: Secondary | ICD-10-CM | POA: Diagnosis not present

## 2024-06-25 DIAGNOSIS — I77819 Aortic ectasia, unspecified site: Secondary | ICD-10-CM | POA: Diagnosis not present

## 2024-06-25 DIAGNOSIS — N179 Acute kidney failure, unspecified: Secondary | ICD-10-CM | POA: Diagnosis not present

## 2024-06-25 DIAGNOSIS — I10 Essential (primary) hypertension: Secondary | ICD-10-CM | POA: Diagnosis not present

## 2024-06-25 DIAGNOSIS — I2510 Atherosclerotic heart disease of native coronary artery without angina pectoris: Secondary | ICD-10-CM

## 2024-06-25 NOTE — Patient Instructions (Signed)
 Medication Instructions:  Your physician recommends that you continue on your current medications as directed. Please refer to the Current Medication list given to you today.  *If you need a refill on your cardiac medications before your next appointment, please call your pharmacy*  Lab Work: none If you have labs (blood work) drawn today and your tests are completely normal, you will receive your results only by: MyChart Message (if you have MyChart) OR A paper copy in the mail If you have any lab test that is abnormal or we need to change your treatment, we will call you to review the results.  Testing/Procedures: Echo  Your physician has requested that you have an echocardiogram. Echocardiography is a painless test that uses sound waves to create images of your heart. It provides your doctor with information about the size and shape of your heart and how well your heart's chambers and valves are working. This procedure takes approximately one hour. There are no restrictions for this procedure. Please do NOT wear cologne, perfume, aftershave, or lotions (deodorant is allowed). Please arrive 15 minutes prior to your appointment time.  Please note: We ask at that you not bring children with you during ultrasound (echo/ vascular) testing. Due to room size and safety concerns, children are not allowed in the ultrasound rooms during exams. Our front office staff cannot provide observation of children in our lobby area while testing is being conducted. An adult accompanying a patient to their appointment will only be allowed in the ultrasound room at the discretion of the ultrasound technician under special circumstances. We apologize for any inconvenience.   Follow-Up: At [facility], you and your health needs are our priority.  As part of our continuing mission to provide you with exceptional heart care, our providers are all part of one team.  This team includes your primary  Cardiologist (physician) and Advanced Practice Providers or APPs (Physician Assistants and Nurse Practitioners) who all work together to provide you with the care you need, when you need it.  Your next appointment:   1 year  Provider:   Dr. Kate  We recommend signing up for the patient portal called MyChart.  Sign up information is provided on this After Visit Summary.  MyChart is used to connect with patients for Virtual Visits (Telemedicine).  Patients are able to view lab/test results, encounter notes, upcoming appointments, etc.  Non-urgent messages can be sent to your provider as well.   To learn more about what you can do with MyChart, go to [email or web address].   Other Instructions none

## 2024-06-26 ENCOUNTER — Ambulatory Visit: Payer: Self-pay | Admitting: Cardiology

## 2024-06-26 LAB — BASIC METABOLIC PANEL WITH GFR
BUN/Creatinine Ratio: 13 (ref 10–24)
BUN: 15 mg/dL (ref 8–27)
CO2: 21 mmol/L (ref 20–29)
Calcium: 9.2 mg/dL (ref 8.6–10.2)
Chloride: 105 mmol/L (ref 96–106)
Creatinine, Ser: 1.19 mg/dL (ref 0.76–1.27)
Glucose: 117 mg/dL — ABNORMAL HIGH (ref 70–99)
Potassium: 4.9 mmol/L (ref 3.5–5.2)
Sodium: 140 mmol/L (ref 134–144)
eGFR: 67 mL/min/1.73 (ref >59)

## 2024-06-28 ENCOUNTER — Other Ambulatory Visit: Payer: Self-pay | Admitting: Cardiology

## 2024-07-14 ENCOUNTER — Other Ambulatory Visit: Payer: Self-pay | Admitting: Cardiology

## 2024-07-29 ENCOUNTER — Inpatient Hospital Stay: Payer: Medicare HMO | Admitting: Oncology

## 2024-07-29 ENCOUNTER — Inpatient Hospital Stay: Payer: Medicare HMO | Attending: Oncology

## 2024-07-29 VITALS — BP 134/75 | HR 97 | Temp 97.8°F | Resp 18 | Ht 72.0 in | Wt 226.1 lb

## 2024-07-29 DIAGNOSIS — C9110 Chronic lymphocytic leukemia of B-cell type not having achieved remission: Secondary | ICD-10-CM | POA: Insufficient documentation

## 2024-07-29 DIAGNOSIS — D72829 Elevated white blood cell count, unspecified: Secondary | ICD-10-CM

## 2024-07-29 DIAGNOSIS — H409 Unspecified glaucoma: Secondary | ICD-10-CM | POA: Diagnosis not present

## 2024-07-29 DIAGNOSIS — M1990 Unspecified osteoarthritis, unspecified site: Secondary | ICD-10-CM | POA: Insufficient documentation

## 2024-07-29 DIAGNOSIS — K219 Gastro-esophageal reflux disease without esophagitis: Secondary | ICD-10-CM | POA: Diagnosis not present

## 2024-07-29 DIAGNOSIS — E785 Hyperlipidemia, unspecified: Secondary | ICD-10-CM | POA: Diagnosis not present

## 2024-07-29 DIAGNOSIS — I10 Essential (primary) hypertension: Secondary | ICD-10-CM | POA: Diagnosis not present

## 2024-07-29 LAB — CBC WITH DIFFERENTIAL (CANCER CENTER ONLY)
Abs Immature Granulocytes: 0.02 K/uL (ref 0.00–0.07)
Basophils Absolute: 0.1 K/uL (ref 0.0–0.1)
Basophils Relative: 1 %
Eosinophils Absolute: 0.2 K/uL (ref 0.0–0.5)
Eosinophils Relative: 1 %
HCT: 46.5 % (ref 39.0–52.0)
Hemoglobin: 15.1 g/dL (ref 13.0–17.0)
Immature Granulocytes: 0 %
Lymphocytes Relative: 64 %
Lymphs Abs: 8.7 K/uL — ABNORMAL HIGH (ref 0.7–4.0)
MCH: 29.9 pg (ref 26.0–34.0)
MCHC: 32.5 g/dL (ref 30.0–36.0)
MCV: 92.1 fL (ref 80.0–100.0)
Monocytes Absolute: 0.6 K/uL (ref 0.1–1.0)
Monocytes Relative: 4 %
Neutro Abs: 4 K/uL (ref 1.7–7.7)
Neutrophils Relative %: 30 %
Platelet Count: 154 K/uL (ref 150–400)
RBC: 5.05 MIL/uL (ref 4.22–5.81)
RDW: 14 % (ref 11.5–15.5)
Smear Review: NORMAL
WBC Count: 13.5 K/uL — ABNORMAL HIGH (ref 4.0–10.5)
nRBC: 0 % (ref 0.0–0.2)

## 2024-07-29 NOTE — Progress Notes (Signed)
  [facility] OFFICE PROGRESS NOTE   Diagnosis: CLL  INTERVAL HISTORY:   [name] returns as scheduled.  He had a left hip replacement in August.  Left leg edema is improving.  He developed an upper respiratory infection a few weeks ago with a cough and mild sore throat.  No dyspnea or fever.  His symptoms are improving.  He has received influenza, COVID 19, and pneumonia vaccines.  Objective:  Vital signs in last 24 hours:  Blood pressure 134/75, pulse 97, temperature 97.8 F (36.6 C), temperature source Temporal, resp. rate 18, height 6' (1.829 m), weight 226 lb 1.6 oz (102.6 kg), SpO2 98%.    Lymphatics: No cervical, supraclavicular, axillary, or inguinal nodes Resp: Lungs clear bilaterally Cardio: Regular rate and rhythm GI: No hepatosplenomegaly Vascular: The left lower leg is slightly larger than the right side, no erythema or tenderness   Lab Results:  Lab Results  Component Value Date   WBC 13.5 (H) 07/29/2024   HGB 15.1 07/29/2024   HCT 46.5 07/29/2024   MCV 92.1 07/29/2024   PLT 154 07/29/2024   NEUTROABS 4.0 07/29/2024    CMP  Lab Results  Component Value Date   NA 140 06/25/2024   K 4.9 06/25/2024   CL 105 06/25/2024   CO2 21 06/25/2024   GLUCOSE 117 (H) 06/25/2024   BUN 15 06/25/2024   CREATININE 1.19 06/25/2024   CALCIUM  9.2 06/25/2024   PROT 7.1 02/01/2020   ALBUMIN 4.6 02/01/2020   AST 18 02/01/2020   ALT 23 02/01/2020   ALKPHOS 68 02/01/2020   BILITOT 0.8 02/01/2020   GFRNONAA 41 (L) 02/01/2020   GFRAA 47 (L) 02/01/2020    Medications: I have reviewed the patient's current medications.   Assessment/Plan: Lymphocytosis-CLL Peripheral blood flow cytometry 10/23/2021-monoclonal B-cell population, CD5, CD19, CD20, CD38, CD200, and lambda positive  Hypertension Glaucoma Gastroesophageal reflux Hyperlipidemia Arthritis     Disposition: Mr Joshua Copeland has early stage CLL.  He is asymptomatic and stable from a hematologic  standpoint.  There is no indication for treating the CLL at present.  He will remain up-to-date on influenza and pneumonia vaccines.  Mr Joshua Copeland will return for an office visit in 1 year.  Arley Hof, MD  07/29/2024  12:30 PM

## 2024-08-17 DIAGNOSIS — R972 Elevated prostate specific antigen [PSA]: Secondary | ICD-10-CM | POA: Diagnosis not present

## 2024-08-19 ENCOUNTER — Ambulatory Visit (HOSPITAL_COMMUNITY): Admission: RE | Admit: 2024-08-19 | Discharge: 2024-08-19 | Attending: Cardiology | Admitting: Cardiology

## 2024-08-19 DIAGNOSIS — I77810 Thoracic aortic ectasia: Secondary | ICD-10-CM | POA: Insufficient documentation

## 2024-08-19 DIAGNOSIS — I10 Essential (primary) hypertension: Secondary | ICD-10-CM | POA: Diagnosis not present

## 2024-08-19 DIAGNOSIS — E785 Hyperlipidemia, unspecified: Secondary | ICD-10-CM | POA: Insufficient documentation

## 2024-08-19 DIAGNOSIS — I2510 Atherosclerotic heart disease of native coronary artery without angina pectoris: Secondary | ICD-10-CM

## 2024-08-19 DIAGNOSIS — I77819 Aortic ectasia, unspecified site: Secondary | ICD-10-CM

## 2024-08-19 LAB — ECHOCARDIOGRAM COMPLETE
Area-P 1/2: 5.93 cm2
S' Lateral: 3.2 cm

## 2024-08-24 DIAGNOSIS — R350 Frequency of micturition: Secondary | ICD-10-CM | POA: Diagnosis not present

## 2024-08-24 DIAGNOSIS — N400 Enlarged prostate without lower urinary tract symptoms: Secondary | ICD-10-CM | POA: Diagnosis not present

## 2024-08-24 DIAGNOSIS — R3914 Feeling of incomplete bladder emptying: Secondary | ICD-10-CM | POA: Diagnosis not present

## 2024-08-24 DIAGNOSIS — R3912 Poor urinary stream: Secondary | ICD-10-CM | POA: Diagnosis not present
